# Patient Record
Sex: Female | Born: 1980
Health system: Southern US, Community
[De-identification: ages and names within clinical notes are randomized; demographics above are authoritative.]

## PROBLEM LIST (undated history)

## (undated) DIAGNOSIS — F419 Anxiety disorder, unspecified: Secondary | ICD-10-CM

## (undated) DIAGNOSIS — R87619 Unspecified abnormal cytological findings in specimens from cervix uteri: Secondary | ICD-10-CM

## (undated) HISTORY — DX: Unspecified abnormal cytological findings in specimens from cervix uteri: R87.619

## (undated) HISTORY — DX: Anxiety disorder, unspecified: F41.9

---

## 2007-05-03 ENCOUNTER — Emergency Department (HOSPITAL_COMMUNITY): Admission: EM | Admit: 2007-05-03 | Discharge: 2007-05-03 | Payer: Self-pay | Admitting: Emergency Medicine

## 2008-06-08 ENCOUNTER — Emergency Department (HOSPITAL_COMMUNITY): Admission: EM | Admit: 2008-06-08 | Discharge: 2008-06-08 | Payer: Self-pay | Admitting: Emergency Medicine

## 2008-08-20 ENCOUNTER — Emergency Department (HOSPITAL_COMMUNITY): Admission: EM | Admit: 2008-08-20 | Discharge: 2008-08-20 | Payer: Self-pay | Admitting: Emergency Medicine

## 2008-10-04 ENCOUNTER — Emergency Department (HOSPITAL_COMMUNITY): Admission: EM | Admit: 2008-10-04 | Discharge: 2008-10-04 | Payer: Self-pay | Admitting: Emergency Medicine

## 2008-12-09 ENCOUNTER — Emergency Department (HOSPITAL_COMMUNITY): Admission: EM | Admit: 2008-12-09 | Discharge: 2008-12-09 | Payer: Self-pay | Admitting: Emergency Medicine

## 2010-09-13 LAB — POCT RAPID STREP A (OFFICE): Streptococcus, Group A Screen (Direct): NEGATIVE

## 2010-09-15 LAB — POCT I-STAT, CHEM 8
Calcium, Ion: 1.19 mmol/L (ref 1.12–1.32)
Hemoglobin: 14.3 g/dL (ref 12.0–15.0)
Potassium: 4.1 mEq/L (ref 3.5–5.1)
Sodium: 142 mEq/L (ref 135–145)

## 2011-12-05 ENCOUNTER — Encounter (HOSPITAL_COMMUNITY): Payer: Self-pay | Admitting: *Deleted

## 2011-12-05 ENCOUNTER — Emergency Department (HOSPITAL_COMMUNITY): Payer: BC Managed Care – PPO

## 2011-12-05 ENCOUNTER — Emergency Department (HOSPITAL_COMMUNITY)
Admission: EM | Admit: 2011-12-05 | Discharge: 2011-12-06 | Disposition: A | Discharge status: Home / Self Care | Payer: BC Managed Care – PPO | Attending: Emergency Medicine | Admitting: Emergency Medicine

## 2011-12-05 DIAGNOSIS — R319 Hematuria, unspecified: Secondary | ICD-10-CM

## 2011-12-05 DIAGNOSIS — R102 Pelvic and perineal pain: Secondary | ICD-10-CM

## 2011-12-05 DIAGNOSIS — R1031 Right lower quadrant pain: Secondary | ICD-10-CM | POA: Insufficient documentation

## 2011-12-05 DIAGNOSIS — F17200 Nicotine dependence, unspecified, uncomplicated: Secondary | ICD-10-CM | POA: Insufficient documentation

## 2011-12-05 DIAGNOSIS — R1024 Suprapubic pain: Secondary | ICD-10-CM

## 2011-12-05 DIAGNOSIS — N760 Acute vaginitis: Secondary | ICD-10-CM

## 2011-12-05 LAB — URINALYSIS, ROUTINE W REFLEX MICROSCOPIC
Glucose, UA: NEGATIVE mg/dL
Nitrite: NEGATIVE
Specific Gravity, Urine: 1.002 — ABNORMAL LOW (ref 1.005–1.030)
pH: 7 (ref 5.0–8.0)

## 2011-12-05 LAB — CBC WITH DIFFERENTIAL/PLATELET
Basophils Absolute: 0 10*3/uL (ref 0.0–0.1)
Basophils Relative: 0 % (ref 0–1)
Eosinophils Absolute: 0.2 10*3/uL (ref 0.0–0.7)
Eosinophils Relative: 2 % (ref 0–5)
HCT: 40.9 % (ref 36.0–46.0)
Lymphocytes Relative: 23 % (ref 12–46)
Lymphs Abs: 3 10*3/uL (ref 0.7–4.0)
Monocytes Absolute: 0.7 10*3/uL (ref 0.1–1.0)
Monocytes Relative: 5 % (ref 3–12)
Neutro Abs: 9.1 10*3/uL — ABNORMAL HIGH (ref 1.7–7.7)
Platelets: 286 10*3/uL (ref 150–400)

## 2011-12-05 LAB — COMPREHENSIVE METABOLIC PANEL
ALT: 12 U/L (ref 0–35)
Alkaline Phosphatase: 82 U/L (ref 39–117)
Calcium: 9.7 mg/dL (ref 8.4–10.5)
GFR calc Af Amer: >90 mL/min (ref >90)
Glucose, Bld: 86 mg/dL (ref 70–99)
Sodium: 137 mEq/L (ref 135–145)
Total Bilirubin: 0.3 mg/dL (ref 0.3–1.2)

## 2011-12-05 LAB — URINE MICROSCOPIC-ADD ON

## 2011-12-05 LAB — PREGNANCY, URINE: Preg Test, Ur: NEGATIVE

## 2011-12-05 MED ORDER — OXYCODONE-ACETAMINOPHEN 5-325 MG PO TABS
1.0000 | ORAL_TABLET | Freq: Once | ORAL | Status: AC
  Administered 2011-12-05: 1 via ORAL
  Filled 2011-12-05: qty 1

## 2011-12-05 MED ORDER — PHENAZOPYRIDINE HCL 100 MG PO TABS
95.0000 mg | ORAL_TABLET | Freq: Once | ORAL | Status: AC
  Administered 2011-12-06: 100 mg via ORAL
  Filled 2011-12-05: qty 1

## 2011-12-05 NOTE — ED Notes (Signed)
She has had bloody urine with urinary frequency and some lower abd pain just started earlier today. lmp  3 weeks ago

## 2011-12-05 NOTE — ED Provider Notes (Signed)
History     CSN: [id number]  Arrival date & time 12/05/11  1919   First MD Initiated Contact with Patient 12/05/11 2115      Chief Complaint  Patient presents with  . Abdominal Pain    (Consider location/radiation/quality/duration/timing/severity/associated sxs/prior treatment) HPI Comments: Patient is a current everyday smoker who presents emergency department with chief complaint of hematuria.  Associated symptoms include abdominal pain, urinary frequency, dysuria.  Patient reports symptoms began acutely today around lunchtime while at work.  Pain is rated at a 8/10 and is described as a colicky crampy type feeling.  She denies any CVA tenderness, back pain, vaginal discharge, recent upper respiratory infection, family history of renal disease, flank pain, recent trauma, bleeding disorder, cyclic hematuria, recent travel, nausea, vomiting, change in bowel movements.  Last menstrual period was 3 weeks ago.  Patient is a 32 y.o. female presenting with abdominal pain. The history is provided by the patient.  Abdominal Pain The primary symptoms of the illness include abdominal pain.    History reviewed. No pertinent past medical history.  History reviewed. No pertinent past surgical history.  History reviewed. No pertinent family history.  History  Substance Use Topics  . Smoking status: Current Everyday Smoker  . Smokeless tobacco: Not on file  . Alcohol Use: Yes    OB History    Grav Para Term Preterm Abortions TAB SAB Ect Mult Living                  Review of Systems  Gastrointestinal: Positive for abdominal pain.  All other systems reviewed and are negative.    Allergies  Review of patient's allergies indicates no known allergies.  Home Medications  No current outpatient prescriptions on file.  BP 107/61  Pulse 66  Temp 98.2 F (36.8 C) (Oral)  Resp 18  SpO2 100%  LMP 11/14/2011  Physical Exam  Nursing note and vitals reviewed. Constitutional: She is  oriented to person, place, and time. She appears well-developed and well-nourished. No distress.  HENT:  Head: Normocephalic and atraumatic.  Mouth/Throat: Oropharynx is clear and moist. No oropharyngeal exudate.  Eyes: Conjunctivae and EOM are normal. Pupils are equal, round, and reactive to light. No scleral icterus.  Neck: Normal range of motion. Neck supple. No tracheal deviation present. No thyromegaly present.  Cardiovascular: Normal rate, regular rhythm, normal heart sounds and intact distal pulses.   Pulmonary/Chest: Effort normal and breath sounds normal. No stridor. No respiratory distress. She has no wheezes.  Abdominal: Soft. Normal appearance, normal aorta and bowel sounds are normal. There is tenderness in the suprapubic area.    Genitourinary:       Exam performed by Jaci Carrel,  exam chaperoned Date: 12/06/2011 Pelvic exam: normal external genitalia without evidence of trauma. VULVA: normal appearing vulva with no masses, tenderness or lesion. VAGINA: normal appearing vagina with normal color and discharge, no lesions. CERVIX: normal appearing cervix without lesions, cervical motion tenderness absent, cervical os closed with out purulent discharge; vaginal discharge - white, Wet prep and DNA probe for chlamydia and GC obtained.   ADNEXA: normal adnexa in size, nontender and no masses UTERUS: uterus is normal size, shape, consistency and nontender.    Musculoskeletal: Normal range of motion. She exhibits no edema and no tenderness.  Neurological: She is alert and oriented to person, place, and time. Coordination normal.  Skin: Skin is warm and dry. No rash noted. She is not diaphoretic. No erythema. No pallor.  Psychiatric: She has a  normal mood and affect. Her behavior is normal.    ED Course  Procedures (including critical care time)  Labs Reviewed  URINALYSIS, ROUTINE W REFLEX MICROSCOPIC - Abnormal; Notable for the following:    Color, Urine RED (*)  BIOCHEMICALS  MAY BE AFFECTED BY COLOR   APPearance HAZY (*)     Specific Gravity, Urine 1.002 (*)     Hgb urine dipstick LARGE (*)     Protein, ur 30 (*)     Leukocytes, UA LARGE (*)     All other components within normal limits  CBC WITH DIFFERENTIAL - Abnormal; Notable for the following:    WBC 13.0 (*)     Neutro Abs 9.1 (*)     All other components within normal limits  URINE MICROSCOPIC-ADD ON - Abnormal; Notable for the following:    Squamous Epithelial / LPF FEW (*)     All other components within normal limits  PREGNANCY, URINE  COMPREHENSIVE METABOLIC PANEL  URINE CULTURE  WET PREP, GENITAL  GC/CHLAMYDIA PROBE AMP, GENITAL   US Renal  12/05/2011  *RADIOLOGY REPORT*  Clinical Data: Hematuria, right lower quadrant and suprapubic pain.  RENAL/URINARY TRACT ULTRASOUND COMPLETE  Comparison:  None  Findings:  Right Kidney:  10.0 cm. Normal size and echotexture.  No focal abnormality.  No hydronephrosis.  Left Kidney:  10.0 cm. Normal size and echotexture.  No focal abnormality.  No hydronephrosis.  Bladder:  Normal appearance.  Bilateral ureteral jets noted.  IMPRESSION: Normal study.  Original Report Authenticated By: Cyndie Chime, M.D.     No diagnosis found.    MDM  Gross Hematuria, Suprapubic pain, BV  Urine culture pending.  No evidence of hydronephrosis on ultrasound.  Pelvic exam with no adnexal or cervical motion tenderness.  Pt has been advised to followup with urology in regards to her hematuria.  Patient will be given pain medications and instructions to take Pyridium on discharge to help with urinary symptoms.  Patient verbalizes understanding and is stable in no acute distress prior to discharge. Pt treated for [facility] w clinda 300mg  BID x 7 days.  Patient appears reliable source for followup.        Jaci Carrel, [place] 12/06/11 [phone]

## 2011-12-05 NOTE — ED Notes (Addendum)
Pt. Reports having blood in her urine and clots at approx 2000. "When I went to the bathroom my urine was right red, I thought I had started my period, but I haven't. I was not having any issues earlier today. It came on all of a sudden."  Burning and sharp pain with urination x 1 day.  Flank pain x 1 day. Abdominal pain in lower quadrants bilaterally. Denies N/V/D. No bowel movement since urinary symptoms occurred. Not aware of blood in stool. A.O. X 4.

## 2011-12-06 LAB — GC/CHLAMYDIA PROBE AMP, GENITAL: Chlamydia, DNA Probe: NEGATIVE

## 2011-12-06 LAB — WET PREP, GENITAL: Trich, Wet Prep: NONE SEEN

## 2011-12-06 MED ORDER — CLINDAMYCIN HCL 150 MG PO CAPS: 300.0000 mg | ORAL_CAPSULE | Freq: Two times a day (BID) | ORAL | Status: AC

## 2011-12-06 MED ORDER — OXYCODONE-ACETAMINOPHEN 5-325 MG PO TABS: 2.0000 | ORAL_TABLET | ORAL | Status: AC | PRN

## 2011-12-06 MED ORDER — CLINDAMYCIN HCL 300 MG PO CAPS
300.0000 mg | ORAL_CAPSULE | Freq: Once | ORAL | Status: AC
  Administered 2011-12-06: 300 mg via ORAL
  Filled 2011-12-06: qty 1

## 2011-12-06 MED ORDER — PHENAZOPYRIDINE HCL 95 MG PO TABS: 95.0000 mg | ORAL_TABLET | Freq: Three times a day (TID) | ORAL | Status: AC | PRN

## 2011-12-06 NOTE — ED Provider Notes (Signed)
Medical screening examination/treatment/procedure(s) were performed by non-physician practitioner and as supervising physician I was immediately available for consultation/collaboration.  Constantine Ruddick R. Herminio Kniskern, MD 12/06/11 2354 

## 2011-12-07 LAB — URINE CULTURE: Colony Count: >=100000

## 2011-12-08 NOTE — ED Notes (Signed)
+   Urine Chart sent to EDP office for review. 

## 2011-12-12 NOTE — ED Notes (Signed)
Pt called for results, not feeling any better.  Pt informed of (+) URNC and need for additional tx.  Rx called to [facility] [phone].

## 2013-05-29 IMAGING — US US RENAL
1 series · 14 of 25 positions shown · non-contrast
Comparison: None

CLINICAL DATA: Hematuria, right lower quadrant and suprapubic pain.

RENAL/URINARY TRACT ULTRASOUND COMPLETE

[Series 1: us renal · 0.20mm/px · 14 of 26 slices shown]
[im 1/26]
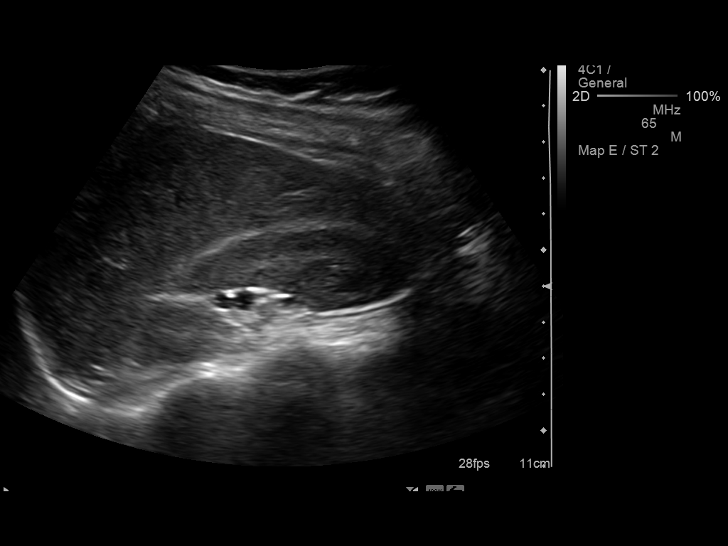
[im 3/26]
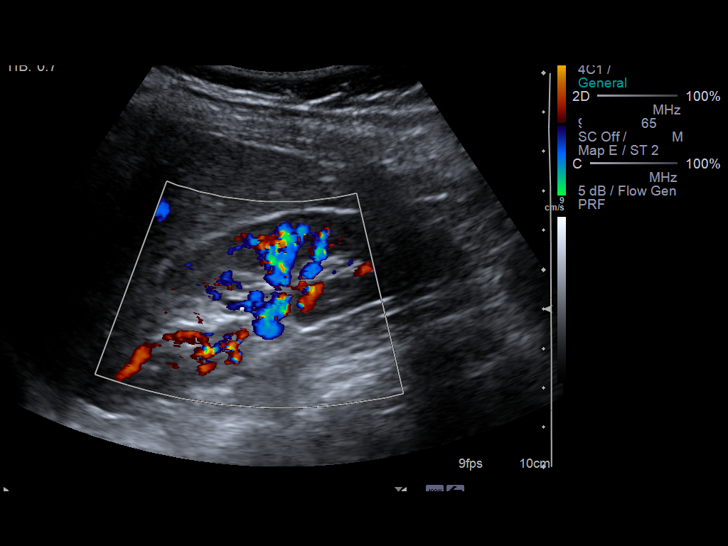
[im 5/26]
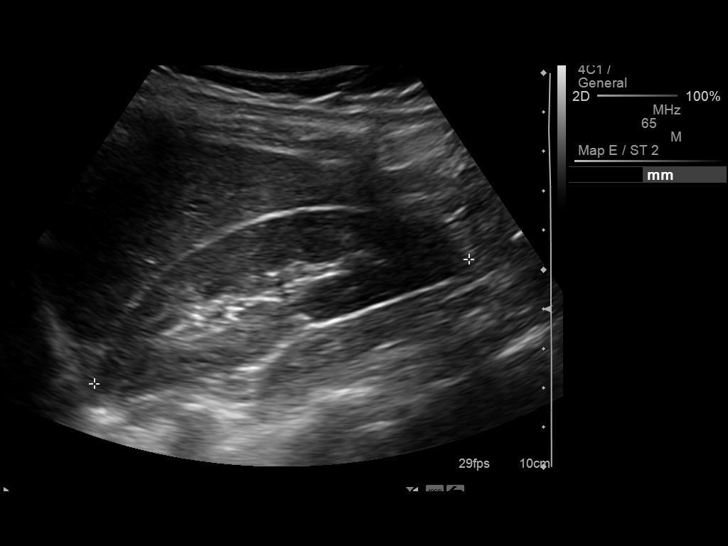
[im 7/26]
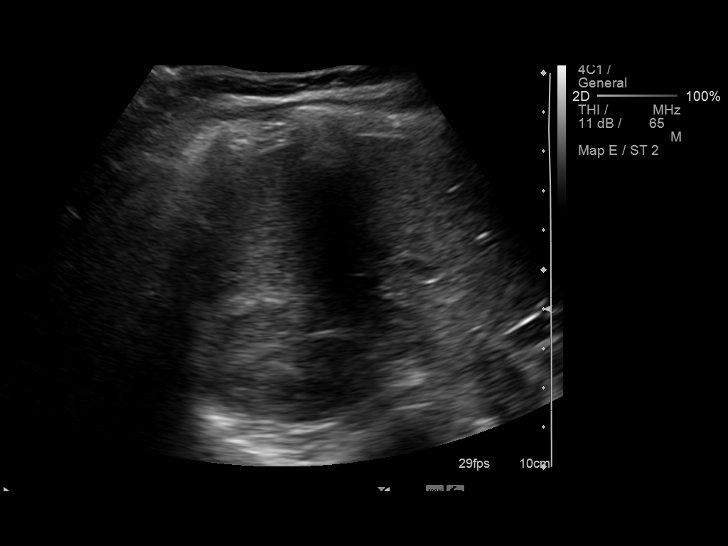
[im 9/26]
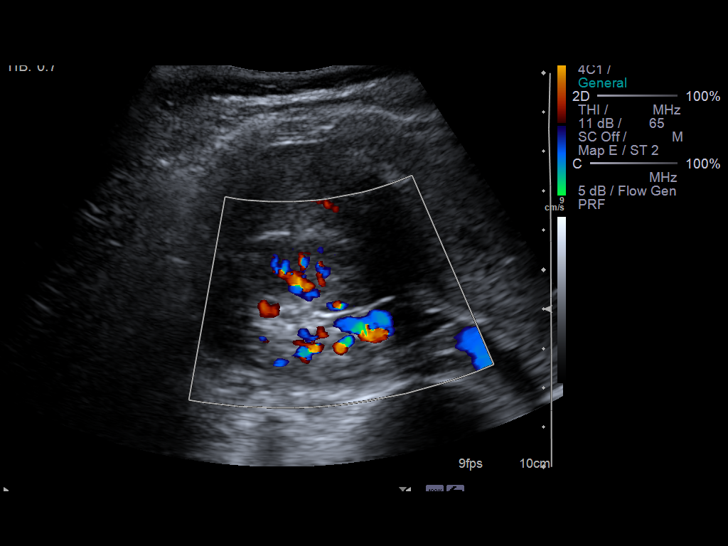
[im 10/26]
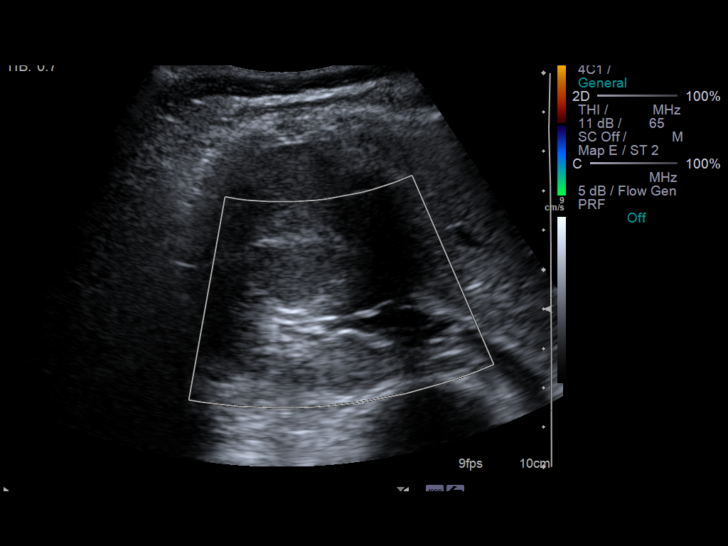
[im 12/26]
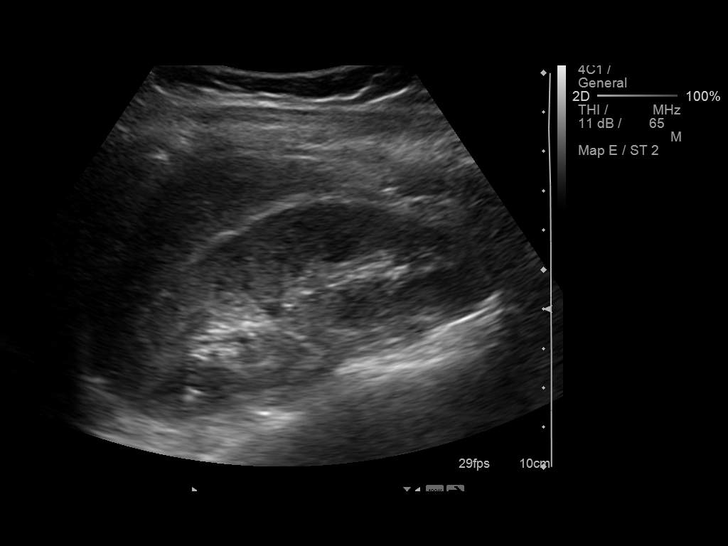
[im 14/26]
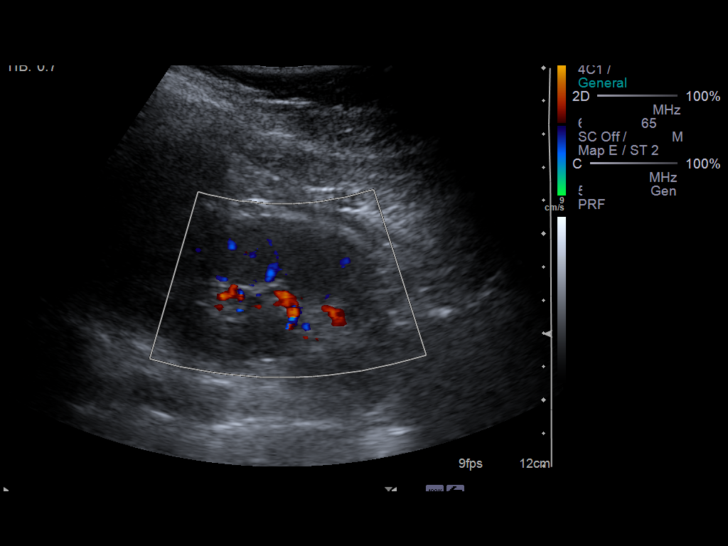
[im 16/26]
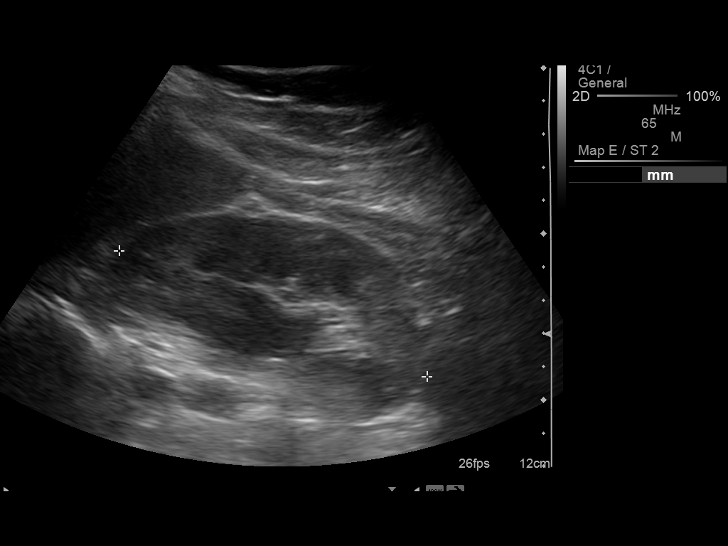
[im 17/26]
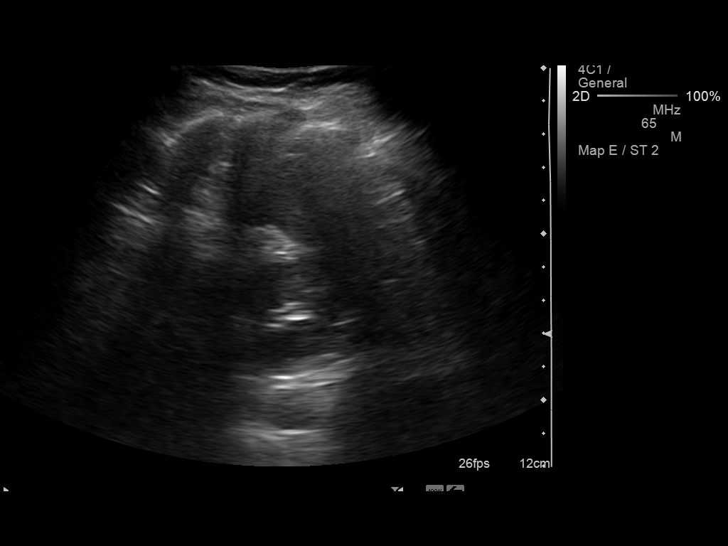
[im 19/26]
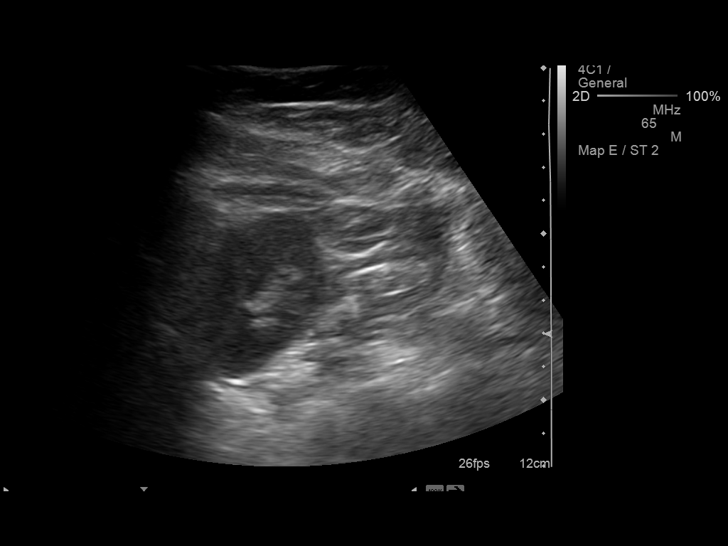
[im 21/26]
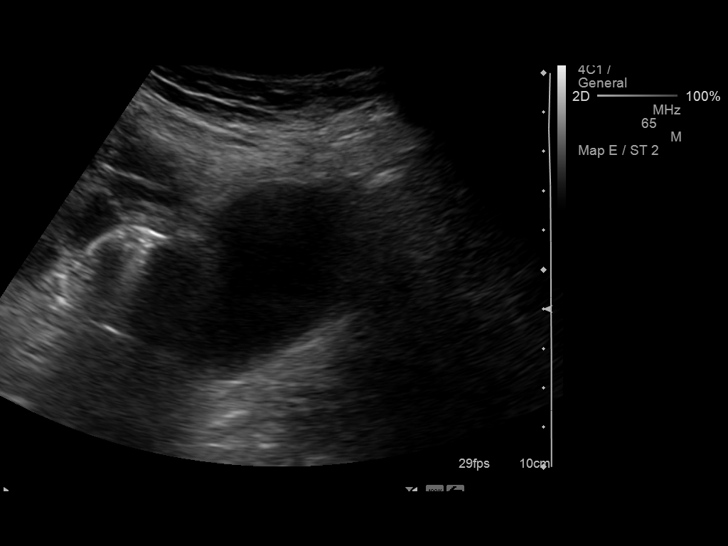
[im 23/26]
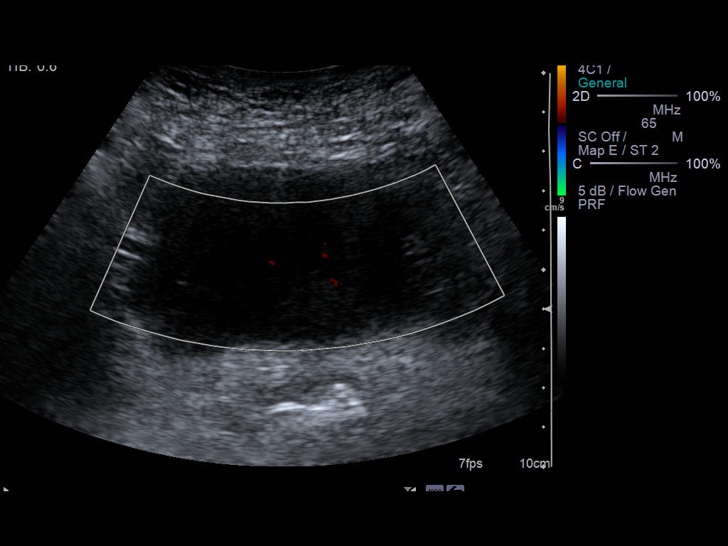
[im 26/26]
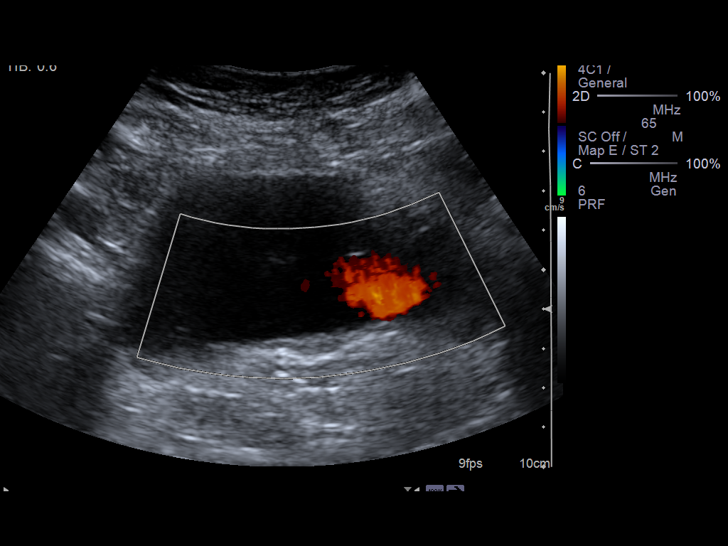

[14 of 25 positions shown; findings below may reference images not displayed]

FINDINGS: Right Kidney:  10.0 cm. Normal size and echotexture.  No focal
abnormality.  No hydronephrosis.

Left Kidney:  10.0 cm. Normal size and echotexture.  No focal
abnormality.  No hydronephrosis.

Bladder:  Normal appearance.  Bilateral ureteral jets noted.
IMPRESSION: Normal study.

## 2014-05-04 ENCOUNTER — Ambulatory Visit (INDEPENDENT_AMBULATORY_CARE_PROVIDER_SITE_OTHER): Payer: BC Managed Care – PPO | Admitting: Family Medicine

## 2014-05-04 ENCOUNTER — Encounter: Payer: Self-pay | Admitting: Family Medicine

## 2014-05-04 VITALS — BP 106/80 | HR 77 | Temp 98.8°F | Resp 16 | Ht 62.0 in | Wt 127.2 lb

## 2014-05-04 DIAGNOSIS — D649 Anemia, unspecified: Secondary | ICD-10-CM

## 2014-05-04 DIAGNOSIS — R42 Dizziness and giddiness: Secondary | ICD-10-CM

## 2014-05-04 LAB — CBC
HCT: 34.8 % — ABNORMAL LOW (ref 36.0–46.0)
Hemoglobin: 11.8 g/dL — ABNORMAL LOW (ref 12.0–15.0)
MCH: 29.1 pg (ref 26.0–34.0)
MCHC: 33.9 g/dL (ref 30.0–36.0)
MCV: 85.9 fL (ref 78.0–100.0)
MPV: 9.6 fL (ref 9.4–12.4)
Platelets: 332 10*3/uL (ref 150–400)
RBC: 4.05 MIL/uL (ref 3.87–5.11)
RDW: 13.6 % (ref 11.5–15.5)
WBC: 6.3 10*3/uL (ref 4.0–10.5)

## 2014-05-04 NOTE — Progress Notes (Signed)
   Subjective:    Patient ID: Lori LundElizabeth Radilla, female    DOB: 09-Aug-1980, 33 y.o.   MRN: [id number]  HPI Patient presents today to establish care with a PCP and have DMV paperwork completed.   In June, the patient drove to work and had an episode of lightheadedness. She was in the parking lot at work and EMS was called. She refused transport to the ED because she did not feel that the episode warranted a trip to the ED. The episode was transient, lasting a few minutes. She felt like she had difficulty focusing and felt "fuzzy". She felt cold and clammy and her palms were sweaty. She has not had further episodes. She thinks it may have been a panic attack as she was going through a difficult time with her boyfriend and it was the same as episodes she had as a teenager. She [facility] . She has gone to several urgent cares to get the paper work completed, but has been unable to find someone to complete them.   Episode- Felt like it was difficult to focus. Lasted a couple of minutes. Felt fuzzy, cold and clammy and palms were sweaty. Fingers felt tingly. No illicit drugs.   Works for a [employer]ing [employer]. Some stress with job. Has 32 yo and 586 yo children. Is in a stable relationship.   Last pap 2 years ago. Had abnormal pap in her 1420s, most recent pap normal.   No past medical history on file. No past surgical history on file. No family history on file. History  Substance Use Topics  . Smoking status: Current Every Day Smoker  . Smokeless tobacco: Not on file  . Alcohol Use: Yes    Review of Systems No chest pain, no SOB, no palpitations, occasional headaches. No fever/chills. No falls.     Objective:   Physical Exam  Constitutional: She is oriented to person, place, and time. She appears well-developed and well-nourished.  HENT:  Head: Normocephalic and atraumatic.  Right Ear: Tympanic membrane, external ear and ear canal normal.    Left Ear: Tympanic membrane, external ear and ear canal normal.  Nose: Nose normal.  Mouth/Throat: Oropharynx is clear and moist.  Eyes: Conjunctivae and EOM are normal. Pupils are equal, round, and reactive to light. Right eye exhibits no discharge. Left eye exhibits no discharge. No scleral icterus.  Neck: Normal range of motion. Neck supple. No thyromegaly present.  Cardiovascular: Normal rate, regular rhythm, normal heart sounds and intact distal pulses.   Pulmonary/Chest: Effort normal and breath sounds normal.  Musculoskeletal: Normal range of motion. She exhibits no edema.  Lymphadenopathy:    She has no cervical adenopathy.  Neurological: She is alert and oriented to person, place, and time.  Skin: Skin is warm and dry.  Psychiatric: She has a normal mood and affect. Her behavior is normal. Judgment and thought content normal.  Vitals reviewed.     Assessment & Plan:  Discussed with Dr. Katrinka BlazingSmith  1. Light headedness - EKG 12-Lead - CBC - Comprehensive metabolic panel - TSH - I will keep paperwork in my drawer until labs return and will call her once completed.   Emi Belfasteborah B. Allis Quirarte, FNP-BC  [facility], [facility]  05/04/2014 4:56 PM

## 2014-05-05 LAB — COMPREHENSIVE METABOLIC PANEL
ALBUMIN: 4.1 g/dL (ref 3.5–5.2)
ALK PHOS: 61 U/L (ref 39–117)
ALT: 14 U/L (ref 0–35)
AST: 16 U/L (ref 0–37)
BILIRUBIN TOTAL: 0.2 mg/dL (ref 0.2–1.2)
BUN: 8 mg/dL (ref 6–23)
CALCIUM: 8.9 mg/dL (ref 8.4–10.5)
CHLORIDE: 107 meq/L (ref 96–112)
CO2: 24 mEq/L (ref 19–32)
CREATININE: 0.51 mg/dL (ref 0.50–1.10)
GLUCOSE: 92 mg/dL (ref 70–99)
Potassium: 4 mEq/L (ref 3.5–5.3)
Sodium: 140 mEq/L (ref 135–145)
TOTAL PROTEIN: 6.5 g/dL (ref 6.0–8.3)

## 2014-05-05 LAB — TSH: TSH: 2.001 u[IU]/mL (ref 0.350–4.500)

## 2014-12-12 ENCOUNTER — Emergency Department (HOSPITAL_COMMUNITY)
Admission: EM | Admit: 2014-12-12 | Discharge: 2014-12-12 | Disposition: A | Discharge status: Home / Self Care | Payer: BLUE CROSS/BLUE SHIELD | Source: Home / Self Care

## 2014-12-12 ENCOUNTER — Encounter (HOSPITAL_COMMUNITY): Payer: Self-pay | Admitting: Emergency Medicine

## 2014-12-12 DIAGNOSIS — N390 Urinary tract infection, site not specified: Secondary | ICD-10-CM

## 2014-12-12 LAB — POCT URINALYSIS DIP (DEVICE)
Glucose, UA: 500 mg/dL — AB
Ketones, ur: 15 mg/dL — AB
Nitrite: POSITIVE — AB
Protein, ur: >=300 mg/dL — AB
Specific Gravity, Urine: 1.01 (ref 1.005–1.030)
Urobilinogen, UA: >=8 mg/dL (ref 0.0–1.0)
pH: 6 (ref 5.0–8.0)

## 2014-12-12 LAB — POCT PREGNANCY, URINE: Preg Test, Ur: NEGATIVE

## 2014-12-12 MED ORDER — CIPROFLOXACIN HCL 500 MG PO TABS: 500.0000 mg | ORAL_TABLET | Freq: Two times a day (BID) | ORAL | Status: DC

## 2014-12-12 NOTE — ED Notes (Signed)
C/o uti

## 2014-12-12 NOTE — ED Provider Notes (Signed)
CSN: [id number]     Arrival date & time 12/12/14  1626 History   First MD Initiated Contact with Patient 12/12/14 1731     Chief Complaint  Patient presents with  . Urinary Tract Infection   (Consider location/radiation/quality/duration/timing/severity/associated sxs/prior Treatment) Patient is a 34 y.o. female presenting with urinary tract infection. The history is provided by the patient.  Urinary Tract Infection Pain quality:  Burning Pain severity:  Moderate Onset quality:  Gradual Duration:  4 days Timing:  Constant Progression:  Worsening Chronicity:  New Recent urinary tract infections: no   Relieved by:  Cranberry juice and phenazopyridine Worsened by:  Nothing tried Ineffective treatments:  Cranberry juice and phenazopyridine Urinary symptoms: frequent urination and hematuria   Risk factors: sexually active     History reviewed. No pertinent past medical history. History reviewed. No pertinent past surgical history. History reviewed. No pertinent family history. History  Substance Use Topics  . Smoking status: Current Every Day Smoker  . Smokeless tobacco: Not on file  . Alcohol Use: Yes   OB History    No data available     Review of Systems  Allergies  Review of patient's allergies indicates no known allergies.  Home Medications   Prior to Admission medications   Medication Sig Start Date End Date Taking? Authorizing Provider  ciprofloxacin (CIPRO) 500 MG tablet Take 1 tablet (500 mg total) by mouth 2 (two) times daily. 12/12/14   Elvina SidleKurt Artesia Berkey, MD  Multiple Vitamins-Minerals (MULTI-VITAMIN GUMMIES) CHEW Chew by mouth 4 (four) times a week.    Historical Provider, MD   BP 102/76 mmHg  Pulse 66  Temp(Src) 97.6 F (36.4 C) (Oral)  Resp 12  SpO2 98%  LMP 11/28/2014 Physical Exam  Constitutional: She appears well-developed and well-nourished.  Abdominal: Soft. Bowel sounds are normal. She exhibits no distension and no mass. There is no tenderness.  There is no rebound and no guarding.  Nursing note and vitals reviewed.   ED Course  Procedures (including critical care time) Labs Review Labs Reviewed  POCT URINALYSIS DIP (DEVICE) - Abnormal; Notable for the following:    Glucose, UA 500 (*)    Bilirubin Urine MODERATE (*)    Ketones, ur 15 (*)    Hgb urine dipstick MODERATE (*)    Protein, ur >=300 (*)    Nitrite POSITIVE (*)    Leukocytes, UA LARGE (*)    All other components within normal limits  POCT PREGNANCY, URINE    Imaging Review No results found.   MDM   1. UTI (lower urinary tract infection)    Elvina SidleKurt Tremond Shimabukuro, MD    Elvina SidleKurt Brynli Ollis, MD 12/12/14 [phone]

## 2014-12-12 NOTE — Discharge Instructions (Signed)

## 2014-12-14 LAB — URINE CULTURE: Culture: >=100000

## 2018-11-29 ENCOUNTER — Ambulatory Visit: Payer: BLUE CROSS/BLUE SHIELD | Admitting: Family Medicine

## 2018-11-29 ENCOUNTER — Other Ambulatory Visit: Payer: Self-pay

## 2018-11-29 ENCOUNTER — Encounter: Payer: Self-pay | Admitting: Family Medicine

## 2018-11-29 ENCOUNTER — Ambulatory Visit (INDEPENDENT_AMBULATORY_CARE_PROVIDER_SITE_OTHER): Payer: BC Managed Care – PPO | Admitting: Family Medicine

## 2018-11-29 ENCOUNTER — Ambulatory Visit: Payer: Self-pay | Admitting: Registered Nurse

## 2018-11-29 VITALS — BP 101/68 | HR 74 | Temp 98.4°F | Resp 16 | Ht 66.0 in | Wt 151.0 lb

## 2018-11-29 DIAGNOSIS — Z0001 Encounter for general adult medical examination with abnormal findings: Secondary | ICD-10-CM

## 2018-11-29 DIAGNOSIS — Z23 Encounter for immunization: Secondary | ICD-10-CM

## 2018-11-29 DIAGNOSIS — Z130 Encounter for screening for diseases of the blood and blood-forming organs and certain disorders involving the immune mechanism: Secondary | ICD-10-CM | POA: Diagnosis not present

## 2018-11-29 DIAGNOSIS — Z13228 Encounter for screening for other metabolic disorders: Secondary | ICD-10-CM

## 2018-11-29 DIAGNOSIS — Z1329 Encounter for screening for other suspected endocrine disorder: Secondary | ICD-10-CM | POA: Diagnosis not present

## 2018-11-29 DIAGNOSIS — F411 Generalized anxiety disorder: Secondary | ICD-10-CM

## 2018-11-29 DIAGNOSIS — Z113 Encounter for screening for infections with a predominantly sexual mode of transmission: Secondary | ICD-10-CM | POA: Diagnosis not present

## 2018-11-29 DIAGNOSIS — Z0000 Encounter for general adult medical examination without abnormal findings: Secondary | ICD-10-CM

## 2018-11-29 DIAGNOSIS — Z13220 Encounter for screening for lipoid disorders: Secondary | ICD-10-CM

## 2018-11-29 LAB — POCT URINALYSIS DIP (MANUAL ENTRY)
Bilirubin, UA: NEGATIVE
Blood, UA: NEGATIVE
Glucose, UA: NEGATIVE mg/dL
Leukocytes, UA: NEGATIVE
Nitrite, UA: NEGATIVE
Protein Ur, POC: NEGATIVE mg/dL
Spec Grav, UA: >=1.03 — AB (ref 1.010–1.025)
Urobilinogen, UA: 0.2 E.U./dL
pH, UA: 5 (ref 5.0–8.0)

## 2018-11-29 NOTE — Progress Notes (Signed)
6/26/20202:03 PM  Lori LundElizabeth Thompson 1980-09-20, 38 y.o., female [id number]  Chief Complaint  Patient presents with  . Annual Exam    no pap    HPI:   Patient is a 38 y.o. female who presents today for CPE  New patient G&Ps: 2002 Pap: dec 2017, neg pap and HPV, remote h/o abnormal pap s/p LEEP, normal paps since STD: testing today BC : BTL Menses: monthly, regular, LMP 11/21/2018 Mammogram: never had FHX breast/ovarian cancer: Maternal aunt ovarian cancer, postmenopausal FHx colon cancer: denies Exercise/diet: 3 miles treadmill daily, average, tries to eat healthy Former smoker: quit feb 2017 Etoh: declines  ilicit drugs: declines Sees dentist twice a year Sees eye doctor yearly  Reports anxiety has been increasing of recent Has started to exercise for past 3 weeks, helping Never done counseling nor medications - prefers to manage with LFM   There is no immunization history on file for this patient.  Depression screen [facility] 2/9 11/29/2018 05/04/2014  Decreased Interest 0 0  Down, Depressed, Hopeless 0 0  PHQ - 2 Score 0 0    Fall Risk  11/29/2018 05/04/2014  Falls in the past year? 0 No  Number falls in past yr: 1 -  Injury with Fall? 0 -     No Known Allergies  Prior to Admission medications   Not on File    Past Medical History:  Diagnosis Date  . Abnormal Pap smear of cervix   . Anxiety     Past Surgical History:  Procedure Laterality Date  . CESAREAN SECTION      Social History   Tobacco Use  . Smoking status: Former Smoker    Quit date: 07/2015    Years since quitting: 3.4  . Smokeless tobacco: Never Used  Substance Use Topics  . Alcohol use: Not Currently    Family History  Problem Relation Age of Onset  . Hyperlipidemia Mother   . Stroke Maternal Grandfather   . Heart disease Paternal Grandmother     Review of Systems  Constitutional: Negative for chills and fever.  Respiratory: Negative for cough and shortness of breath.    Cardiovascular: Negative for chest pain, palpitations and leg swelling.  Gastrointestinal: Negative for abdominal pain, nausea and vomiting.  All other systems reviewed and are negative.    OBJECTIVE:  Today's Vitals   11/29/18 1351  BP: 101/68  Pulse: 74  Resp: 16  Temp: 98.4 F (36.9 C)  TempSrc: Oral  SpO2: 96%  Weight: 151 lb (68.5 kg)  Height: 5\' 6"  (1.676 m)   Body mass index is 24.37 kg/m.  Hearing Screening   125Hz  250Hz  500Hz  1000Hz  2000Hz  3000Hz  4000Hz  6000Hz  8000Hz   Right ear:           Left ear:             Visual Acuity Screening   Right eye Left eye Both eyes  Without correction:     With correction: 20/13 20/13 20/13   uses contact lenses  Physical Exam Vitals signs and nursing note reviewed. Exam conducted with a chaperone present.  Constitutional:      Appearance: She is well-developed.  HENT:     Head: Normocephalic and atraumatic.     Right Ear: Hearing, tympanic membrane, ear canal and external ear normal.     Left Ear: Hearing, tympanic membrane, ear canal and external ear normal.     Mouth/Throat:     Mouth: Mucous membranes are moist.     Pharynx: No  oropharyngeal exudate or posterior oropharyngeal erythema.  Eyes:     Extraocular Movements: Extraocular movements intact.     Conjunctiva/sclera: Conjunctivae normal.     Pupils: Pupils are equal, round, and reactive to light.  Neck:     Musculoskeletal: Neck supple.     Thyroid: No thyromegaly.  Cardiovascular:     Rate and Rhythm: Normal rate and regular rhythm.     Heart sounds: Normal heart sounds. No murmur. No friction rub. No gallop.   Pulmonary:     Effort: Pulmonary effort is normal.     Breath sounds: Normal breath sounds. No wheezing, rhonchi or rales.  Chest:     Breasts:        Right: No mass, nipple discharge or skin change.        Left: No mass, nipple discharge or skin change.  Abdominal:     General: Bowel sounds are normal. There is no distension.     Palpations:  Abdomen is soft. There is no hepatomegaly, splenomegaly or mass.     Tenderness: There is no abdominal tenderness.  Musculoskeletal: Normal range of motion.     Right lower leg: No edema.     Left lower leg: No edema.  Lymphadenopathy:     Cervical: No cervical adenopathy.     Upper Body:     Right upper body: No supraclavicular, axillary or pectoral adenopathy.     Left upper body: No supraclavicular, axillary or pectoral adenopathy.  Skin:    General: Skin is warm and dry.  Neurological:     Mental Status: She is alert and oriented to person, place, and time.     Cranial Nerves: No cranial nerve deficit.     Gait: Gait normal.     Deep Tendon Reflexes: Reflexes are normal and symmetric.  Psychiatric:        Mood and Affect: Mood normal.        Behavior: Behavior normal.     Results for orders placed or performed in visit on 11/29/18 (from the past 24 hour(s))  POCT urinalysis dipstick     Status: Abnormal   Collection Time: 11/29/18  2:32 PM  Result Value Ref Range   Color, UA yellow yellow   Clarity, UA clear clear   Glucose, UA negative negative mg/dL   Bilirubin, UA negative negative   Ketones, POC UA small (15) (A) negative mg/dL   Spec Grav, UA >=5.284>=1.030 (A) 1.010 - 1.025   Blood, UA negative negative   pH, UA 5.0 5.0 - 8.0   Protein Ur, POC negative negative mg/dL   Urobilinogen, UA 0.2 0.2 or 1.0 E.U./dL   Nitrite, UA Negative Negative   Leukocytes, UA Negative Negative    ASSESSMENT and PLAN  1. Annual physical exam Routine HCM labs ordered. HCM reviewed/discussed. Anticipatory guidance regarding healthy weight, lifestyle and choices given.   2. Screen for STD (sexually transmitted disease) - HIV Antibody (routine testing w rflx) - GC/Chlamydia probe amp ([place])not at [facility]  3. Screening for thyroid disorder - TSH  4. Screening for deficiency anemia - CBC with Differential/Platelet  5. Screening for endocrine, metabolic and immunity disorder -  POCT urinalysis dipstick - Comprehensive metabolic panel  6. Screening for lipid disorders - Lipid panel  7. Generalized anxiety disorder Discussed treatment options, advised learning about mindful techniques. Call if she wants a referral for counseling, appt if wants to discuss medications  Other orders - Tdap vaccine greater than or equal to 7yo IM  Return in about 1 year (around 11/29/2019).   Rutherford Guys, MD [facility] at [place], [place] [address] Ph.  [phone] Fax [phone]

## 2018-11-29 NOTE — Patient Instructions (Addendum)
If you have lab work done today you will be contacted with your lab  Living With Anxiety  After being diagnosed with an anxiety disorder, you may be relieved to know why you have felt or behaved a certain way. It is natural to also feel overwhelmed about the treatment ahead and what it will mean for your life. With care and support, you can manage this condition and recover from it. How to cope with anxiety Dealing with stress Stress is your body's reaction to life changes and events, both good and bad. Stress can last just a few hours or it can be ongoing. Stress can play a major role in anxiety, so it is important to learn both how to cope with stress and how to think about it differently. Talk with your health care provider or a counselor to learn more about stress reduction. He or she may suggest some stress reduction techniques, such as:  Music therapy. This can include creating or listening to music that you enjoy and that inspires you.  Mindfulness-based meditation. This involves being aware of your normal breaths, rather than trying to control your breathing. It can be done while sitting or walking.  Centering prayer. This is a kind of meditation that involves focusing on a word, phrase, or sacred image that is meaningful to you and that brings you peace.  Deep breathing. To do this, expand your stomach and inhale slowly through your nose. Hold your breath for 3-5 seconds. Then exhale slowly, allowing your stomach muscles to relax.  Self-talk. This is a skill where you identify thought patterns that lead to anxiety reactions and correct those thoughts.  Muscle relaxation. This involves tensing muscles then relaxing them. Choose a stress reduction technique that fits your lifestyle and personality. Stress reduction techniques take time and practice. Set aside 5-15 minutes a day to do them. Therapists can offer training in these techniques. The training may be covered by some  insurance plans. Other things you can do to manage stress include:  Keeping a stress diary. This can help you learn what triggers your stress and ways to control your response.  Thinking about how you respond to certain situations. You may not be able to control everything, but you can control your reaction.  Making time for activities that help you relax, and not feeling guilty about spending your time in this way. Therapy combined with coping and stress-reduction skills provides the best chance for successful treatment. Medicines Medicines can help ease symptoms. Medicines for anxiety include:  Anti-anxiety drugs.  Antidepressants.  Beta-blockers. Medicines may be used as the main treatment for anxiety disorder, along with therapy, or if other treatments are not working. Medicines should be prescribed by a health care provider. Relationships Relationships can play a big part in helping you recover. Try to spend more time connecting with trusted friends and family members. Consider going to couples counseling, taking family education classes, or going to family therapy. Therapy can help you and others better understand the condition. How to recognize changes in your condition Everyone has a different response to treatment for anxiety. Recovery from anxiety happens when symptoms decrease and stop interfering with your daily activities at home or work. This may mean that you will start to:  Have better concentration and focus.  Sleep better.  Be less irritable.  Have more energy.  Have improved memory. It is important to recognize when your condition is getting worse. Contact your health care provider if  your symptoms interfere with home or work and you do not feel like your condition is improving. Where to find help and support: You can get help and support from these sources:  Self-help groups.  Online and [employer].  A trusted spiritual leader.  Couples  counseling.  Family education classes.  Family therapy. Follow these instructions at home:  Eat a healthy diet that includes plenty of vegetables, fruits, whole grains, low-fat dairy products, and lean protein. Do not eat a lot of foods that are high in solid fats, added sugars, or salt.  Exercise. Most adults should do the following: ? Exercise for at least 150 minutes each week. The exercise should increase your heart rate and make you sweat (moderate-intensity exercise). ? Strengthening exercises at least twice a week.  Cut down on caffeine, tobacco, alcohol, and other potentially harmful substances.  Get the right amount and quality of sleep. Most adults need 7-9 hours of sleep each night.  Make choices that simplify your life.  Take over-the-counter and prescription medicines only as told by your health care provider.  Avoid caffeine, alcohol, and certain over-the-counter cold medicines. These may make you feel worse. Ask your pharmacist which medicines to avoid.  Keep all follow-up visits as told by your health care provider. This is important. Questions to ask your health care provider  Would I benefit from therapy?  How often should I follow up with a health care provider?  How long do I need to take medicine?  Are there any long-term side effects of my medicine?  Are there any alternatives to taking medicine? Contact a health care provider if:  You have a hard time staying focused or finishing daily tasks.  You spend many hours a day feeling worried about everyday life.  You become exhausted by worry.  You start to have headaches, feel tense, or have nausea.  You urinate more than normal.  You have diarrhea. Get help right away if:  You have a racing heart and shortness of breath.  You have thoughts of hurting yourself or others. If you ever feel like you may hurt yourself or others, or have thoughts about taking your own life, get help right away. You  can go to your nearest emergency department or call:  Your local emergency services (911 in the [place]  A suicide crisis helpline, such as the [place] at 631-666-8647. This is open 24-hours a day. Summary  Taking steps to deal with stress can help calm you.  Medicines cannot cure anxiety disorders, but they can help ease symptoms.  Family, friends, and partners can play a big part in helping you recover from an anxiety disorder. This information is not intended to replace advice given to you by your health care provider. Make sure you discuss any questions you have with your health care provider. Document Released: 05/16/2016 Document Revised: 05/16/2016 Document Reviewed: 05/16/2016 Elsevier Interactive Patient Education  [employer]. results within the next 2 weeks.  If you have not heard from [place] then please contact us. The fastest way to get your results is to register for My Chart.   IF you received an x-ray today, you will receive an invoice from [facility]. Please contact [facility] at [phone] with questions or concerns regarding your invoice.   IF you received labwork today, you will receive an invoice from [facility]. Please contact [facility] at [phone] with questions or concerns regarding your invoice.   Our billing staff will not  be able to assist you with questions regarding bills from these companies.  You will be contacted with the lab results as soon as they are available. The fastest way to get your results is to activate your My Chart account. Instructions are located on the last page of this paperwork. If you have not heard from [place] regarding the results in 2 weeks, please contact this office.       Preventive Care 49-25 Years Old, Female Preventive care refers to visits with your health care provider and lifestyle choices that can promote health and wellness. This includes:  A yearly physical exam. This  may also be called an annual well check.  Regular dental visits and eye exams.  Immunizations.  Screening for certain conditions.  Healthy lifestyle choices, such as eating a healthy diet, getting regular exercise, not using drugs or products that contain nicotine and tobacco, and limiting alcohol use. What can I expect for my preventive care visit? Physical exam Your health care provider will check your:  Height and weight. This may be used to calculate body mass index (BMI), which tells if you are at a healthy weight.  Heart rate and blood pressure.  Skin for abnormal spots. Counseling Your health care provider may ask you questions about your:  Alcohol, tobacco, and drug use.  Emotional well-being.  Home and relationship well-being.  Sexual activity.  Eating habits.  Work and work Statistician.  Method of birth control.  Menstrual cycle.  Pregnancy history. What immunizations do I need?  Influenza (flu) vaccine  This is recommended every year. Tetanus, diphtheria, and pertussis (Tdap) vaccine  You may need a Td booster every 10 years. Varicella (chickenpox) vaccine  You may need this if you have not been vaccinated. Human papillomavirus (HPV) vaccine  If recommended by your health care provider, you may need three doses over 6 months. Measles, mumps, and rubella (MMR) vaccine  You may need at least one dose of MMR. You may also need a second dose. Meningococcal conjugate (MenACWY) vaccine  One dose is recommended if you are age 39-21 years and a first-year college student living in a residence hall, or if you have one of several medical conditions. You may also need additional booster doses. Pneumococcal conjugate (PCV13) vaccine  You may need this if you have certain conditions and were not previously vaccinated. Pneumococcal polysaccharide (PPSV23) vaccine  You may need one or two doses if you smoke cigarettes or if you have certain  conditions. Hepatitis A vaccine  You may need this if you have certain conditions or if you travel or work in places where you may be exposed to hepatitis A. Hepatitis B vaccine  You may need this if you have certain conditions or if you travel or work in places where you may be exposed to hepatitis B. Haemophilus influenzae type b (Hib) vaccine  You may need this if you have certain conditions. You may receive vaccines as individual doses or as more than one vaccine together in one shot (combination vaccines). Talk with your health care provider about the risks and benefits of combination vaccines. What tests do I need?  Blood tests  Lipid and cholesterol levels. These may be checked every 5 years starting at age 43.  Hepatitis C test.  Hepatitis B test. Screening  Diabetes screening. This is done by checking your blood sugar (glucose) after you have not eaten for a while (fasting).  Sexually transmitted disease (STD) testing.  BRCA-related cancer screening. This may  be done if you have a family history of breast, ovarian, tubal, or peritoneal cancers.  Pelvic exam and Pap test. This may be done every 3 years starting at age 32. Starting at age 27, this may be done every 5 years if you have a Pap test in combination with an HPV test. Talk with your health care provider about your test results, treatment options, and if necessary, the need for more tests. Follow these instructions at home: Eating and drinking   Eat a diet that includes fresh fruits and vegetables, whole grains, lean protein, and low-fat dairy.  Take vitamin and mineral supplements as recommended by your health care provider.  Do not drink alcohol if: ? Your health care provider tells you not to drink. ? You are pregnant, may be pregnant, or are planning to become pregnant.  If you drink alcohol: ? Limit how much you have to 0-1 drink a day. ? Be aware of how much alcohol is in your drink. In the [place] one  drink equals one 12 oz bottle of beer (355 mL), one 5 oz glass of wine (148 mL), or one 1 oz glass of hard liquor (44 mL). Lifestyle  Take daily care of your teeth and gums.  Stay active. Exercise for at least 30 minutes on 5 or more days each week.  Do not use any products that contain nicotine or tobacco, such as cigarettes, e-cigarettes, and chewing tobacco. If you need help quitting, ask your health care provider.  If you are sexually active, practice safe sex. Use a condom or other form of birth control (contraception) in order to prevent pregnancy and STIs (sexually transmitted infections). If you plan to become pregnant, see your health care provider for a preconception visit. What's next?  Visit your health care provider once a year for a well check visit.  Ask your health care provider how often you should have your eyes and teeth checked.  Stay up to date on all vaccines. This information is not intended to replace advice given to you by your health care provider. Make sure you discuss any questions you have with your health care provider. Document Released: 07/18/2001 Document Revised: 01/31/2018 Document Reviewed: 01/31/2018 Elsevier Patient Education  2020 [employer].

## 2018-11-30 LAB — COMPREHENSIVE METABOLIC PANEL
ALT: 15 IU/L (ref 0–32)
AST: 20 IU/L (ref 0–40)
Albumin/Globulin Ratio: 1.6 (ref 1.2–2.2)
Albumin: 4.1 g/dL (ref 3.8–4.8)
Alkaline Phosphatase: 77 IU/L (ref 39–117)
BUN/Creatinine Ratio: 21 (ref 9–23)
BUN: 14 mg/dL (ref 6–20)
Bilirubin Total: 0.4 mg/dL (ref 0.0–1.2)
CO2: 20 mmol/L (ref 20–29)
Calcium: 9.1 mg/dL (ref 8.7–10.2)
Chloride: 103 mmol/L (ref 96–106)
Creatinine, Ser: 0.67 mg/dL (ref 0.57–1.00)
GFR calc Af Amer: 131 mL/min/{1.73_m2} (ref >59)
GFR calc non Af Amer: 113 mL/min/{1.73_m2} (ref >59)
Globulin, Total: 2.6 g/dL (ref 1.5–4.5)
Glucose: 79 mg/dL (ref 65–99)
Potassium: 4.4 mmol/L (ref 3.5–5.2)
Sodium: 140 mmol/L (ref 134–144)
Total Protein: 6.7 g/dL (ref 6.0–8.5)

## 2018-11-30 LAB — CBC WITH DIFFERENTIAL/PLATELET
Basophils Absolute: 0 10*3/uL (ref 0.0–0.2)
Basos: 1 %
EOS (ABSOLUTE): 0.2 10*3/uL (ref 0.0–0.4)
Eos: 3 %
Hematocrit: 38.2 % (ref 34.0–46.6)
Hemoglobin: 12.4 g/dL (ref 11.1–15.9)
Immature Grans (Abs): 0 10*3/uL (ref 0.0–0.1)
Immature Granulocytes: 0 %
Lymphocytes Absolute: 2.7 10*3/uL (ref 0.7–3.1)
Lymphs: 35 %
MCH: 28.6 pg (ref 26.6–33.0)
MCHC: 32.5 g/dL (ref 31.5–35.7)
MCV: 88 fL (ref 79–97)
Monocytes Absolute: 0.6 10*3/uL (ref 0.1–0.9)
Monocytes: 7 %
Neutrophils Absolute: 4.2 10*3/uL (ref 1.4–7.0)
Neutrophils: 54 %
Platelets: 318 10*3/uL (ref 150–450)
RBC: 4.33 x10E6/uL (ref 3.77–5.28)
RDW: 12.8 % (ref 11.7–15.4)
WBC: 7.7 10*3/uL (ref 3.4–10.8)

## 2018-11-30 LAB — TSH: TSH: 1.25 u[IU]/mL (ref 0.450–4.500)

## 2018-11-30 LAB — LIPID PANEL
Chol/HDL Ratio: 5 ratio — ABNORMAL HIGH (ref 0.0–4.4)
Cholesterol, Total: 212 mg/dL — ABNORMAL HIGH (ref 100–199)
HDL: 42 mg/dL (ref >39)
LDL Calculated: 149 mg/dL — ABNORMAL HIGH (ref 0–99)
Triglycerides: 104 mg/dL (ref 0–149)
VLDL Cholesterol Cal: 21 mg/dL (ref 5–40)

## 2018-11-30 LAB — HIV ANTIBODY (ROUTINE TESTING W REFLEX): HIV Screen 4th Generation wRfx: NONREACTIVE

## 2018-12-02 ENCOUNTER — Telehealth: Payer: Self-pay | Admitting: Family Medicine

## 2018-12-02 NOTE — Telephone Encounter (Signed)
[facility] cytology dept called - they need for the office to collect another urine sample.  Please call [phone]

## 2018-12-02 NOTE — Telephone Encounter (Signed)
Spoke with pt about dropping off urine sample and she states she will when you get some free time.

## 2019-12-01 ENCOUNTER — Other Ambulatory Visit: Payer: Self-pay

## 2019-12-01 ENCOUNTER — Other Ambulatory Visit (HOSPITAL_COMMUNITY)
Admission: RE | Admit: 2019-12-01 | Discharge: 2019-12-01 | Disposition: A | Discharge status: Home / Self Care | Payer: BC Managed Care – PPO | Source: Ambulatory Visit | Attending: Family Medicine | Admitting: Family Medicine

## 2019-12-01 ENCOUNTER — Encounter: Payer: Self-pay | Admitting: Family Medicine

## 2019-12-01 ENCOUNTER — Ambulatory Visit (INDEPENDENT_AMBULATORY_CARE_PROVIDER_SITE_OTHER): Payer: BC Managed Care – PPO | Admitting: Family Medicine

## 2019-12-01 VITALS — BP 110/78 | HR 77 | Temp 98.6°F | Ht 66.0 in | Wt 151.0 lb

## 2019-12-01 DIAGNOSIS — Z113 Encounter for screening for infections with a predominantly sexual mode of transmission: Secondary | ICD-10-CM | POA: Diagnosis present

## 2019-12-01 DIAGNOSIS — Z0000 Encounter for general adult medical examination without abnormal findings: Secondary | ICD-10-CM

## 2019-12-01 DIAGNOSIS — Z0001 Encounter for general adult medical examination with abnormal findings: Secondary | ICD-10-CM

## 2019-12-01 DIAGNOSIS — E7800 Pure hypercholesterolemia, unspecified: Secondary | ICD-10-CM | POA: Diagnosis not present

## 2019-12-01 NOTE — Patient Instructions (Addendum)
   If you have lab work done today you will be contacted with your lab results within the next 2 weeks.  If you have not heard from us then please contact us. The fastest way to get your results is to register for My Chart.   IF you received an x-ray today, you will receive an invoice from [facility]. Please contact [facility] at [phone] with questions or concerns regarding your invoice.   IF you received labwork today, you will receive an invoice from [facility]. Please contact [facility] at [phone] with questions or concerns regarding your invoice.   Our billing staff will not be able to assist you with questions regarding bills from these companies.  You will be contacted with the lab results as soon as they are available. The fastest way to get your results is to activate your My Chart account. Instructions are located on the last page of this paperwork. If you have not heard from us regarding the results in 2 weeks, please contact this office.     Preventive Care 21-39 Years Old, Female Preventive care refers to visits with your health care provider and lifestyle choices that can promote health and wellness. This includes:  A yearly physical exam. This may also be called an annual well check.  Regular dental visits and eye exams.  Immunizations.  Screening for certain conditions.  Healthy lifestyle choices, such as eating a healthy diet, getting regular exercise, not using drugs or products that contain nicotine and tobacco, and limiting alcohol use. What can I expect for my preventive care visit? Physical exam Your health care provider will check your:  Height and weight. This may be used to calculate body mass index (BMI), which tells if you are at a healthy weight.  Heart rate and blood pressure.  Skin for abnormal spots. Counseling Your health care provider may ask you questions about your:  Alcohol, tobacco, and drug use.  Emotional  well-being.  Home and relationship well-being.  Sexual activity.  Eating habits.  Work and work environment.  Method of birth control.  Menstrual cycle.  Pregnancy history. What immunizations do I need?  Influenza (flu) vaccine  This is recommended every year. Tetanus, diphtheria, and pertussis (Tdap) vaccine  You may need a Td booster every 10 years. Varicella (chickenpox) vaccine  You may need this if you have not been vaccinated. Human papillomavirus (HPV) vaccine  If recommended by your health care provider, you may need three doses over 6 months. Measles, mumps, and rubella (MMR) vaccine  You may need at least one dose of MMR. You may also need a second dose. Meningococcal conjugate (MenACWY) vaccine  One dose is recommended if you are age 19-21 years and a first-year college student living in a residence hall, or if you have one of several medical conditions. You may also need additional booster doses. Pneumococcal conjugate (PCV13) vaccine  You may need this if you have certain conditions and were not previously vaccinated. Pneumococcal polysaccharide (PPSV23) vaccine  You may need one or two doses if you smoke cigarettes or if you have certain conditions. Hepatitis A vaccine  You may need this if you have certain conditions or if you travel or work in places where you may be exposed to hepatitis A. Hepatitis B vaccine  You may need this if you have certain conditions or if you travel or work in places where you may be exposed to hepatitis B. Haemophilus influenzae type b (Hib) vaccine  You   may need this if you have certain conditions. You may receive vaccines as individual doses or as more than one vaccine together in one shot (combination vaccines). Talk with your health care provider about the risks and benefits of combination vaccines. What tests do I need?  Blood tests  Lipid and cholesterol levels. These may be checked every 5 years starting at age  20.  Hepatitis C test.  Hepatitis B test. Screening  Diabetes screening. This is done by checking your blood sugar (glucose) after you have not eaten for a while (fasting).  Sexually transmitted disease (STD) testing.  BRCA-related cancer screening. This may be done if you have a family history of breast, ovarian, tubal, or peritoneal cancers.  Pelvic exam and Pap test. This may be done every 3 years starting at age 21. Starting at age 30, this may be done every 5 years if you have a Pap test in combination with an HPV test. Talk with your health care provider about your test results, treatment options, and if necessary, the need for more tests. Follow these instructions at home: Eating and drinking   Eat a diet that includes fresh fruits and vegetables, whole grains, lean protein, and low-fat dairy.  Take vitamin and mineral supplements as recommended by your health care provider.  Do not drink alcohol if: ? Your health care provider tells you not to drink. ? You are pregnant, may be pregnant, or are planning to become pregnant.  If you drink alcohol: ? Limit how much you have to 0-1 drink a day. ? Be aware of how much alcohol is in your drink. In the [place] one drink equals one 12 oz bottle of beer (355 mL), one 5 oz glass of wine (148 mL), or one 1 oz glass of hard liquor (44 mL). Lifestyle  Take daily care of your teeth and gums.  Stay active. Exercise for at least 30 minutes on 5 or more days each week.  Do not use any products that contain nicotine or tobacco, such as cigarettes, e-cigarettes, and chewing tobacco. If you need help quitting, ask your health care provider.  If you are sexually active, practice safe sex. Use a condom or other form of birth control (contraception) in order to prevent pregnancy and STIs (sexually transmitted infections). If you plan to become pregnant, see your health care provider for a preconception visit. What's next?  Visit your health  care provider once a year for a well check visit.  Ask your health care provider how often you should have your eyes and teeth checked.  Stay up to date on all vaccines. This information is not intended to replace advice given to you by your health care provider. Make sure you discuss any questions you have with your health care provider. Document Revised: 01/31/2018 Document Reviewed: 01/31/2018 Elsevier Patient Education  2020 [employer]  

## 2019-12-01 NOTE — Progress Notes (Signed)
6/28/202111:31 AM  Lori Thompson May 09, 1981, 39 y.o., female [id number]  Chief Complaint  Patient presents with  . Annual Exam  . Gynecologic Exam    HPI:   Patient is a 39 y.o. female who presents today for CPE  Last CPE 2020 G&Ps: 2002 Pap: dec 2017, neg pap and HPV, remote h/o abnormal pap s/p LEEP, normal paps since then STD: requesting today BC : BTL Menses: monthly, regular Mammogram: at age 59 FHX breast/ovarian cancer: maternal aunt ovarian cancer FHx colon cancer: denies Exercise/diet: has not been exercising as much, not eating as healthy Needs to find new dentist Has yearly eye exam, wears contact lenses  Lab Results  Component Value Date   CHOL 212 (H) 11/29/2018   HDL 42 11/29/2018   LDLCALC 149 (H) 11/29/2018   TRIG 104 11/29/2018   CHOLHDL 5.0 (H) 11/29/2018    Most Recent Immunizations  Administered Date(s) Administered  . Tdap 11/29/2018    Depression screen [facility] 2/9 12/01/2019 11/29/2018 05/04/2014  Decreased Interest 0 0 0  Down, Depressed, Hopeless 0 0 0  PHQ - 2 Score 0 0 0    Fall Risk  12/01/2019 11/29/2018 05/04/2014  Falls in the past year? 0 0 No  Number falls in past yr: 0 1 -  Injury with Fall? 0 0 -     No Known Allergies  Prior to Admission medications   Not on File    Past Medical History:  Diagnosis Date  . Abnormal Pap smear of cervix   . Anxiety     Past Surgical History:  Procedure Laterality Date  . CESAREAN SECTION      Social History   Tobacco Use  . Smoking status: Former Smoker    Quit date: 07/2015    Years since quitting: 4.4  . Smokeless tobacco: Never Used  Substance Use Topics  . Alcohol use: Not Currently    Family History  Problem Relation Age of Onset  . Hyperlipidemia Mother   . Stroke Maternal Grandfather   . Heart disease Paternal Grandmother     Review of Systems  Constitutional: Negative for chills and fever.  Respiratory: Negative for cough and shortness of breath.     Cardiovascular: Negative for chest pain, palpitations and leg swelling.  Gastrointestinal: Negative for abdominal pain, blood in stool, constipation, diarrhea, heartburn, melena, nausea and vomiting.  Genitourinary: Negative for dysuria and hematuria.       Neg breast lumps or nipple discharge Neg vaginal discharge, pelvic pain, dyspareunia, abnormal vaginal bleeding  Psychiatric/Behavioral: The patient is nervous/anxious.   All other systems reviewed and are negative. per hpi   OBJECTIVE:  Today's Vitals   12/01/19 1054  BP: 110/78  Pulse: 77  Temp: 98.6 F (37 C)  SpO2: 100%  Weight: 151 lb (68.5 kg)  Height: 5\' 6"  (1.676 m)   Body mass index is 24.37 kg/m.   Hearing Screening   125Hz  250Hz  500Hz  1000Hz  2000Hz  3000Hz  4000Hz  6000Hz  8000Hz   Right ear:           Left ear:             Visual Acuity Screening   Right eye Left eye Both eyes  Without correction:     With correction: 20/20 20/20 20/20     Physical Exam Vitals and nursing note reviewed. Exam conducted with a chaperone present.  Constitutional:      Appearance: She is well-developed.  HENT:     Head: Normocephalic and atraumatic.  Right Ear: Hearing, tympanic membrane, ear canal and external ear normal.     Left Ear: Hearing, tympanic membrane, ear canal and external ear normal.     Mouth/Throat:     Mouth: Mucous membranes are moist.     Pharynx: No oropharyngeal exudate or posterior oropharyngeal erythema.  Eyes:     Extraocular Movements: Extraocular movements intact.     Conjunctiva/sclera: Conjunctivae normal.     Pupils: Pupils are equal, round, and reactive to light.  Neck:     Thyroid: No thyromegaly.  Cardiovascular:     Rate and Rhythm: Normal rate and regular rhythm.     Heart sounds: Normal heart sounds. No murmur heard.  No friction rub. No gallop.   Pulmonary:     Effort: Pulmonary effort is normal.     Breath sounds: Normal breath sounds. No wheezing, rhonchi or rales.  Chest:      Breasts:        Right: No mass, nipple discharge or skin change.        Left: No mass, nipple discharge or skin change.  Abdominal:     General: Bowel sounds are normal. There is no distension.     Palpations: Abdomen is soft. There is no hepatomegaly, splenomegaly or mass.     Tenderness: There is no abdominal tenderness.  Musculoskeletal:        General: Normal range of motion.     Cervical back: Neck supple.     Right lower leg: No edema.     Left lower leg: No edema.  Lymphadenopathy:     Cervical: No cervical adenopathy.     Upper Body:     Right upper body: No supraclavicular, axillary or pectoral adenopathy.     Left upper body: No supraclavicular, axillary or pectoral adenopathy.  Skin:    General: Skin is warm and dry.  Neurological:     Mental Status: She is alert and oriented to person, place, and time.     Cranial Nerves: No cranial nerve deficit.     Gait: Gait normal.     Deep Tendon Reflexes: Reflexes are normal and symmetric.  Psychiatric:        Mood and Affect: Mood normal.        Behavior: Behavior normal.      No results found for this or any previous visit (from the past 24 hour(s)).  No results found.   ASSESSMENT and PLAN  1. Annual physical exam No concerns per history or exam. Routine HCM labs ordered. HCM reviewed/discussed. Anticipatory guidance regarding healthy weight, lifestyle and choices given.   2. Screen for STD (sexually transmitted disease) - HIV Antibody (routine testing w rflx) - Hepatitis C antibody - RPR - Urine cytology ancillary only  3. Elevated cholesterol Discussed LFM - Lipid panel  Return in about 1 year (around 11/30/2020).    Myles Lipps, MD Primary Care at [facility] [address] [place], [place] [address] Ph.  [phone] Fax [phone]

## 2019-12-02 LAB — LIPID PANEL
Chol/HDL Ratio: 6.5 ratio — ABNORMAL HIGH (ref 0.0–4.4)
Cholesterol, Total: 221 mg/dL — ABNORMAL HIGH (ref 100–199)
HDL: 34 mg/dL — ABNORMAL LOW (ref >39)
LDL Chol Calc (NIH): 155 mg/dL — ABNORMAL HIGH (ref 0–99)
Triglycerides: 174 mg/dL — ABNORMAL HIGH (ref 0–149)
VLDL Cholesterol Cal: 32 mg/dL (ref 5–40)

## 2019-12-02 LAB — URINE CYTOLOGY ANCILLARY ONLY
Chlamydia: NEGATIVE
Comment: NEGATIVE
Comment: NEGATIVE
Comment: NORMAL
Neisseria Gonorrhea: NEGATIVE
Trichomonas: NEGATIVE

## 2019-12-02 LAB — HIV ANTIBODY (ROUTINE TESTING W REFLEX): HIV Screen 4th Generation wRfx: NONREACTIVE

## 2019-12-02 LAB — RPR: RPR Ser Ql: NONREACTIVE

## 2019-12-02 LAB — HEPATITIS C ANTIBODY: Hep C Virus Ab: <0.1 s/co ratio (ref 0.0–0.9)

## 2020-06-24 ENCOUNTER — Encounter: Payer: Self-pay | Admitting: Family Medicine

## 2020-06-24 ENCOUNTER — Other Ambulatory Visit: Payer: Self-pay

## 2020-06-24 ENCOUNTER — Ambulatory Visit (INDEPENDENT_AMBULATORY_CARE_PROVIDER_SITE_OTHER): Payer: BC Managed Care – PPO | Admitting: Family Medicine

## 2020-06-24 VITALS — BP 113/72 | HR 83 | Temp 97.9°F | Ht 66.0 in | Wt 146.0 lb

## 2020-06-24 DIAGNOSIS — N6310 Unspecified lump in the right breast, unspecified quadrant: Secondary | ICD-10-CM | POA: Diagnosis not present

## 2020-06-24 NOTE — Progress Notes (Addendum)
1/20/202210:27 AM  Lori Thompson [birth year], 40 y.o., female [id number]  Chief Complaint  Patient presents with  . Lump on R breast    First noticed 2 days ago, reports pain to touch as well throbbing sensation. Has hx of irregular periods    HPI:   Patient is a 40 y.o. female with no significant past medical history who presents today for breast pain.  Noticed a breast lump a couple of days ago Never had this in the past Feels throbbing Has 2 kids 10 plus years ago Denies family history of breast cancer  History of irregular periods Tubes are tied Not on birth control Denies nipple discharge or changes to nipple No issues with left breast Nipple piercing in place  Depression screen [facility] 2/9 12/01/2019 11/29/2018 05/04/2014  Decreased Interest 0 0 0  Down, Depressed, Hopeless 0 0 0  PHQ - 2 Score 0 0 0    Fall Risk  12/01/2019 11/29/2018 05/04/2014  Falls in the past year? 0 0 No  Number falls in past yr: 0 1 -  Injury with Fall? 0 0 -     No Known Allergies  Prior to Admission medications   Not on File    Past Medical History:  Diagnosis Date  . Abnormal Pap smear of cervix   . Anxiety     Past Surgical History:  Procedure Laterality Date  . CESAREAN SECTION  2011,2009  . TUBAL LIGATION  07/2009    Social History   Tobacco Use  . Smoking status: Former Smoker    Quit date: 07/2015    Years since quitting: 4.9  . Smokeless tobacco: Never Used  Substance Use Topics  . Alcohol use: Not Currently    Family History  Problem Relation Age of Onset  . Hyperlipidemia Mother   . Hypertension Mother   . Stroke Maternal Grandfather   . Heart disease Paternal Grandmother     Review of Systems  Constitutional: Negative for chills, fever and malaise/fatigue.  Eyes: Negative for blurred vision and double vision.  Respiratory: Negative for cough, shortness of breath and wheezing.   Cardiovascular: Negative for chest pain, palpitations and leg  swelling.  Gastrointestinal: Negative for abdominal pain, blood in stool, constipation, diarrhea, heartburn, nausea and vomiting.  Genitourinary: Negative for dysuria, frequency and hematuria.  Musculoskeletal: Negative for back pain and joint pain.  Skin: Negative for rash.       Right breast lump and tenderness  Neurological: Negative for dizziness, weakness and headaches.     OBJECTIVE:  Today's Vitals   06/24/20 1006  BP: 113/72  Pulse: 83  Temp: 97.9 F (36.6 C)  SpO2: 99%  Weight: 146 lb (66.2 kg)  Height: 5\' 6"  (1.676 m)   Body mass index is 23.57 kg/m.   Physical Exam Constitutional:      General: She is not in acute distress.    Appearance: Normal appearance. She is not ill-appearing.  HENT:     Head: Normocephalic.  Cardiovascular:     Rate and Rhythm: Normal rate and regular rhythm.     Pulses: Normal pulses.     Heart sounds: Normal heart sounds. No murmur heard. No friction rub. No gallop.   Pulmonary:     Effort: Pulmonary effort is normal. No respiratory distress.     Breath sounds: Normal breath sounds. No stridor. No wheezing, rhonchi or rales.  Chest:  Breasts:     Right: Mass and tenderness present. No swelling, bleeding, inverted nipple, nipple  discharge or skin change.     Left: Normal.     Abdominal:     General: Bowel sounds are normal.     Palpations: Abdomen is soft.     Tenderness: There is no abdominal tenderness.  Musculoskeletal:     Right lower leg: No edema.     Left lower leg: No edema.  Skin:    General: Skin is warm and dry.  Neurological:     Mental Status: She is alert and oriented to person, place, and time.  Psychiatric:        Mood and Affect: Mood normal.        Behavior: Behavior normal.     No results found for this or any previous visit (from the past 24 hour(s)).  No results found.   ASSESSMENT and PLAN  Problem List Items Addressed This Visit   None   Visit Diagnoses    Breast mass, right    -   Primary   Relevant Orders   US BREAST LTD UNI RIGHT INC AXILLA   MS DIGITAL SCREENING TOMO BILATERAL       Plan . Follow up with imaging from breast center   Return if symptoms worsen or fail to improve.    Macario Carls Sable Knoles, FNP-BC Primary Care at [facility] [address] [place], [place] [address] Ph.  [phone] Fax [phone]  I have reviewed and agree with above documentation. Edwina Barth, MD

## 2020-06-24 NOTE — Patient Instructions (Addendum)
Breast Cyst  A breast cyst is a sac in the breast that is filled with fluid. They are usually noncancerous (benign) and are common among women. Breast cysts are most often in the upper, outer portion of the breast. One or more cysts may develop. They form when fluid builds up inside the breast glands. There are several types of breast cysts. Some are too small to feel, but these can be seen with imaging tests such as an X-ray of the breast (mammogram) or ultrasound. Breast cysts do not increase your risk of breast cancer. They usually disappear after you no longer have a menstrual cycle (after menopause), unless you take artificial hormones (are on hormone therapy). What are the causes? This condition may be caused by:  Blockage of tubes (ducts) in the breast glands, which leads to fluid buildup. Duct blockage may result from: ? Fibrocystic breast changes. This is a common, benign condition that occurs when women go through hormonal changes during the menstrual cycle. This is a common cause of multiple breast cysts. ? Overgrowth of breast tissue or breast glands. ? Scar tissue in the breast from previous surgery.  Changes in certain female hormones (estrogen and progesterone). The exact cause of this condition is not known. What increases the risk? You may be more likely to develop breast cysts if you have not gone through menopause. What are the signs or symptoms? Symptoms of this condition include:  Feeling one or more smooth, round, soft lumps (like grapes) in the breast that are easily movable. The lump or lumps may get bigger and more painful before your menstrual period and get smaller after your menstrual period.  Breast discomfort or pain. How is this diagnosed? This condition may be diagnosed based on:  A physical exam. A cyst can be felt by your health care provider during this exam.  Imaging tests, such as mammogram or ultrasound. Fluid may be removed from the cyst with a  needle (fine-needle aspiration) and tested to make sure the cyst is not cancerous. How is this treated? Treatment may not be needed for this condition. Your health care provider may monitor the cyst to see if it goes away on its own. If the cyst is uncomfortable or gets bigger, or if you do not like how the cyst makes your breast look, you may need treatment. Treatment may include:  Hormone therapy.  Fine-needle aspiration to drain fluid from the cyst. There is a chance of the cyst coming back (recurring) after aspiration.  Surgery to remove the cyst. Follow these instructions at home: Self-exams  Do a breast self-exam every month, or as often as directed. A breast self-exam involves: ? Comparing your breasts in the mirror. ? Looking for visible changes in your skin or nipples. ? Feeling for lumps or changes.  Having many breast cysts may make it harder to feel for new lumps. Understand how your breasts normally look and feel, and write down any changes in your breasts. Tell your health care provider about any changes.   Eating and drinking  Follow instructions from your health care provider about eating and drinking restrictions.  Drink enough fluid to keep your urine pale yellow.  Avoid caffeine.  Cut down on salt (sodium) in what you eat and drink, especially before your menstrual period. Too much sodium can cause fluid buildup, breast swelling, and discomfort. General instructions  See your health care provider regularly. ? Get a yearly physical exam. ? If you are 75-54 years old,  get a clinical breast exam every 1-3 years. After the age of 40 years, get this exam every year. ? Get mammograms as often as directed.  Take over-the-counter and prescription medicines only as told by your health care provider.  Wear a supportive bra, especially when exercising.  Keep all follow-up visits as told by your health care provider. This is important. Contact a health care provider  if:  You feel, or think you feel, a lump in your breast.  You notice that both breasts look or feel different than usual.  Your breast is still causing pain after your menstrual period is over.  You find new lumps or bumps that were not there before.  You feel lumps in your armpit. Get help right away if:  You have severe pain, tenderness, redness, or warmth in your breast.  You have fluid or blood leaking from your nipple.  Your breast lump becomes hard and painful.  You notice dimpling or wrinkling of the breast or nipple. Summary  A breast cyst is a sac in the breast that is filled with fluid.  Treatment may not be needed for this condition.  If the cyst is uncomfortable or gets bigger, or if you do not like how the cyst makes your breast look, you may need treatment. This information is not intended to replace advice given to you by your health care provider. Make sure you discuss any questions you have with your health care provider. Document Revised: 10/08/2018 Document Reviewed: 10/08/2018 Elsevier Patient Education  2021 [employer].    If you have lab work done today you will be contacted with your lab results within the next 2 weeks.  If you have not heard from [place] then please contact us. The fastest way to get your results is to register for My Chart.   IF you received an x-ray today, you will receive an invoice from [facility]. Please contact [facility] at [phone] with questions or concerns regarding your invoice.   IF you received labwork today, you will receive an invoice from [facility]. Please contact [facility] at [phone] with questions or concerns regarding your invoice.   Our billing staff will not be able to assist you with questions regarding bills from these companies.  You will be contacted with the lab results as soon as they are available. The fastest way to get your results is to activate your My Chart account.  Instructions are located on the last page of this paperwork. If you have not heard from [place] regarding the results in 2 weeks, please contact this office.

## 2020-08-13 ENCOUNTER — Ambulatory Visit
Admission: RE | Admit: 2020-08-13 | Discharge: 2020-08-13 | Disposition: A | Discharge status: Home / Self Care | Payer: BC Managed Care – PPO | Source: Ambulatory Visit | Attending: Family Medicine | Admitting: Family Medicine

## 2020-08-13 ENCOUNTER — Other Ambulatory Visit: Payer: Self-pay

## 2020-08-13 ENCOUNTER — Ambulatory Visit
Admission: RE | Admit: 2020-08-13 | Discharge: 2020-08-13 | Disposition: A | Discharge status: Home / Self Care | Payer: BLUE CROSS/BLUE SHIELD | Source: Ambulatory Visit | Attending: Family Medicine | Admitting: Family Medicine

## 2020-08-13 ENCOUNTER — Other Ambulatory Visit: Payer: Self-pay | Admitting: Family Medicine

## 2020-08-13 DIAGNOSIS — R928 Other abnormal and inconclusive findings on diagnostic imaging of breast: Secondary | ICD-10-CM

## 2020-08-13 DIAGNOSIS — N6310 Unspecified lump in the right breast, unspecified quadrant: Secondary | ICD-10-CM

## 2020-12-01 ENCOUNTER — Encounter: Payer: BC Managed Care – PPO | Admitting: Family Medicine

## 2020-12-02 ENCOUNTER — Other Ambulatory Visit: Payer: Self-pay

## 2020-12-02 ENCOUNTER — Ambulatory Visit: Payer: BC Managed Care – PPO | Admitting: Internal Medicine

## 2020-12-02 ENCOUNTER — Encounter: Payer: Self-pay | Admitting: Internal Medicine

## 2020-12-02 VITALS — BP 102/70 | HR 51 | Temp 98.2°F | Resp 16 | Ht 62.0 in | Wt 149.2 lb

## 2020-12-02 DIAGNOSIS — Z124 Encounter for screening for malignant neoplasm of cervix: Secondary | ICD-10-CM | POA: Insufficient documentation

## 2020-12-02 DIAGNOSIS — R001 Bradycardia, unspecified: Secondary | ICD-10-CM

## 2020-12-02 DIAGNOSIS — Z0001 Encounter for general adult medical examination with abnormal findings: Secondary | ICD-10-CM

## 2020-12-02 LAB — LIPID PANEL
Cholesterol: 225 mg/dL — ABNORMAL HIGH (ref 0–200)
HDL: 41.9 mg/dL (ref >39.00)
LDL Cholesterol: 161 mg/dL — ABNORMAL HIGH (ref 0–99)
NonHDL: 182.76
Total CHOL/HDL Ratio: 5
Triglycerides: 109 mg/dL (ref 0.0–149.0)
VLDL: 21.8 mg/dL (ref 0.0–40.0)

## 2020-12-02 LAB — CBC WITH DIFFERENTIAL/PLATELET
Basophils Absolute: 0 10*3/uL (ref 0.0–0.1)
Basophils Relative: 0.7 % (ref 0.0–3.0)
Eosinophils Absolute: 0.2 10*3/uL (ref 0.0–0.7)
Eosinophils Relative: 3.4 % (ref 0.0–5.0)
HCT: 39.5 % (ref 36.0–46.0)
Hemoglobin: 13.2 g/dL (ref 12.0–15.0)
Lymphocytes Relative: 43.6 % (ref 12.0–46.0)
Lymphs Abs: 2.9 10*3/uL (ref 0.7–4.0)
MCHC: 33.3 g/dL (ref 30.0–36.0)
MCV: 86.8 fl (ref 78.0–100.0)
Monocytes Absolute: 0.4 10*3/uL (ref 0.1–1.0)
Monocytes Relative: 6.7 % (ref 3.0–12.0)
Neutro Abs: 3 10*3/uL (ref 1.4–7.7)
Neutrophils Relative %: 45.6 % (ref 43.0–77.0)
Platelets: 339 10*3/uL (ref 150.0–400.0)
RBC: 4.55 Mil/uL (ref 3.87–5.11)
RDW: 12.7 % (ref 11.5–15.5)
WBC: 6.6 10*3/uL (ref 4.0–10.5)

## 2020-12-02 LAB — BASIC METABOLIC PANEL
BUN: 11 mg/dL (ref 6–23)
CO2: 28 mEq/L (ref 19–32)
Calcium: 9.1 mg/dL (ref 8.4–10.5)
Chloride: 104 mEq/L (ref 96–112)
Creatinine, Ser: 0.61 mg/dL (ref 0.40–1.20)
GFR: 113.13 mL/min (ref >60.00)
Glucose, Bld: 80 mg/dL (ref 70–99)
Potassium: 4.1 mEq/L (ref 3.5–5.1)
Sodium: 138 mEq/L (ref 135–145)

## 2020-12-02 NOTE — Patient Instructions (Signed)
Bradycardia, Adult Bradycardia is a slower-than-normal heartbeat. A normal resting heart rate for an adult ranges from 60 to 100 beats per minute. With bradycardia, the restingheart rate is less than 60 beats per minute. Bradycardia can prevent enough oxygen from reaching certain areas of your body when you are active. It can be serious if it keeps enough oxygen from reaching your brain and other parts of your body. Bradycardia is not a problem foreveryone. For some healthy adults, a slow resting heart rate is normal. What are the causes? This condition may be caused by: A problem with the heart, including: A problem with the heart's electrical system, such as a heart block. With a heart block, electrical signals between the chambers of the heart are partially or completely blocked, so they are not able to work as they should. A problem with the heart's natural pacemaker (sinus node). Heart disease. A heart attack. Heart damage. Lyme disease. A heart infection. A heart condition that is present at birth (congenital heart defect). Certain medicines that treat heart conditions. Certain conditions, such as hypothyroidism and obstructive sleep apnea. Problems with the balance of chemicals and other substances, like potassium, in the blood. Trauma. Radiation therapy. What increases the risk? You are more likely to develop this condition if you: Are age 65 or older. Have high blood pressure (hypertension), high cholesterol (hyperlipidemia), or diabetes. Drink heavily, use tobacco or nicotine products, or use drugs. What are the signs or symptoms? Symptoms of this condition include: Light-headedness. Feeling faint or fainting. Fatigue and weakness. Trouble with activity or exercise. Shortness of breath. Chest pain (angina). Drowsiness. Confusion. Dizziness. How is this diagnosed? This condition may be diagnosed based on: Your symptoms. Your medical history. A physical exam. During  the exam, your health care provider will listen to your heartbeat and check your pulse. To confirm the diagnosis, your health care provider may order tests, such as: Blood tests. An electrocardiogram (ECG). This test records the heart's electrical activity. The test can show how fast your heart is beating and whether the heartbeat is steady. A test in which you wear a portable device (event recorder or Holter monitor) to record your heart's electrical activity while you go about your day. An exercise test. How is this treated? Treatment for this condition depends on the cause of the condition and how severe your symptoms are. Treatment may involve: Treatment of the underlying condition. Changing your medicines or how much medicine you take. Having a small, battery-operated device called a pacemaker implanted under the skin. When bradycardia occurs, this device can be used to increase your heart rate and help your heart beat in a regular rhythm. Follow these instructions at home: Lifestyle  Manage any health conditions that contribute to bradycardia as told by your health care provider. Follow a heart-healthy diet. A nutrition specialist (dietitian) can help educate you about healthy food options and changes. Follow an exercise program that is approved by your health care provider. Maintain a healthy weight. Try to reduce or manage your stress, such as with yoga or meditation. If you need help reducing stress, ask your health care provider. Do not use any products that contain nicotine or tobacco, such as cigarettes, e-cigarettes, and chewing tobacco. If you need help quitting, ask your health care provider. Do not use illegal drugs. Limit alcohol intake to no more than 1 drink a day for nonpregnant women and 2 drinks a day for men. Be aware of how much alcohol is in your drink. In   the [place] one drink equals one 12 oz bottle of beer (355 mL), one 5 oz glass of wine (148 mL), or one 1 oz glass of  hard liquor (44 mL).  General instructions Take over-the-counter and prescription medicines only as told by your health care provider. Keep all follow-up visits as told by your health care provider. This is important. How is this prevented? In some cases, bradycardia may be prevented by: Treating underlying medical problems. Stopping behaviors or medicines that can trigger the condition. Contact a health care provider if you: Feel light-headed or dizzy. Almost faint. Feel weak or are easily fatigued during physical activity. Experience confusion or have memory problems. Get help right away if: You faint. You have: An irregular heartbeat (palpitations). Chest pain. Trouble breathing. Summary Bradycardia is a slower-than-normal heartbeat. With bradycardia, the resting heart rate is less than 60 beats per minute. Treatment for this condition depends on the cause. Manage any health conditions that contribute to bradycardia as told by your health care provider. Do not use any products that contain nicotine or tobacco, such as cigarettes, e-cigarettes, and chewing tobacco, and limit alcohol intake. Keep all follow-up visits as told by your health care provider. This is important. This information is not intended to replace advice given to you by your health care provider. Make sure you discuss any questions you have with your healthcare provider. Document Revised: 12/03/2017 Document Reviewed: 10/31/2017 Elsevier Patient Education  2022 [employer]  

## 2020-12-02 NOTE — Progress Notes (Signed)
Subjective:  Patient ID: Lori Thompson, female    DOB: 1981-01-26  Age: 40 y.o. MRN: [id number]  CC: New Patient (Initial Visit) and Annual Exam  This visit occurred during the SARS-CoV-2 public health emergency.  Safety protocols were in place, including screening questions prior to the visit, additional usage of staff PPE, and extensive cleaning of exam room while observing appropriate contact time as indicated for disinfecting solutions.    HPI Lori Thompson presents for a CPX and to establish.  She has a longstanding history of bradycardia.  She is active and denies any recent episodes of chest pain, shortness of breath, presyncope, syncope, dizziness, lightheadedness, or fatigue.  History Lori Thompson has a past medical history of Abnormal Pap smear of cervix and Anxiety.   She has a past surgical history that includes Tubal ligation (07/2009) and Cesarean section (2011,2009).   Her family history includes Heart disease in her paternal grandmother; Hyperlipidemia in her mother; Hypertension in her mother; Stroke in her maternal grandfather.She reports that she quit smoking about 5 years ago. Her smoking use included cigarettes. She has never used smokeless tobacco. She reports previous alcohol use. She reports that she does not use drugs.  No outpatient medications prior to visit.   No facility-administered medications prior to visit.    ROS Review of Systems  Constitutional:  Negative for diaphoresis and fatigue.  HENT: Negative.    Eyes: Negative.   Respiratory:  Negative for chest tightness, shortness of breath and wheezing.   Cardiovascular:  Negative for chest pain, palpitations and leg swelling.  Gastrointestinal:  Negative for abdominal pain, diarrhea, nausea and vomiting.  Endocrine: Negative.  Negative for cold intolerance and heat intolerance.  Genitourinary: Negative.   Musculoskeletal: Negative.   Skin: Negative.   Neurological: Negative.  Negative for  dizziness, syncope, weakness and light-headedness.  Hematological:  Negative for adenopathy. Does not bruise/bleed easily.   Objective:  BP 102/70 (BP Location: Right Arm, Patient Position: Sitting, Cuff Size: Large)   Pulse (!) 51   Temp 98.2 F (36.8 C) (Oral)   Resp 16   Ht 5\' 2"  (1.575 m)   Wt 149 lb 3.2 oz (67.7 kg)   LMP 11/22/2020 (Exact Date) Comment: BTL  SpO2 100%   BMI 27.29 kg/m   Physical Exam Vitals reviewed.  HENT:     Nose: Nose normal.  Eyes:     General: No scleral icterus.    Conjunctiva/sclera: Conjunctivae normal.  Cardiovascular:     Rate and Rhythm: Regular rhythm. Bradycardia present.     Heart sounds: Normal heart sounds, S1 normal and S2 normal. No murmur heard.   No gallop.     Comments: EKG- Sinus bradycardia, 52 bpm Otherwise normal EKG Pulmonary:     Effort: Pulmonary effort is normal.     Breath sounds: No stridor. No wheezing, rhonchi or rales.  Abdominal:     General: Abdomen is flat.     Palpations: There is no mass.     Tenderness: There is no abdominal tenderness. There is no guarding.  Musculoskeletal:     Cervical back: Neck supple.     Right lower leg: No edema.     Left lower leg: No edema.  Skin:    General: Skin is warm and dry.  Neurological:     General: No focal deficit present.     Mental Status: She is alert.  Psychiatric:        Mood and Affect: Mood normal.  Behavior: Behavior normal.    Lab Results  Component Value Date   WBC 6.6 12/02/2020   HGB 13.2 12/02/2020   HCT 39.5 12/02/2020   PLT 339.0 12/02/2020   GLUCOSE 80 12/02/2020   CHOL 225 (H) 12/02/2020   TRIG 109.0 12/02/2020   HDL 41.90 12/02/2020   LDLCALC 161 (H) 12/02/2020   ALT 15 11/29/2018   AST 20 11/29/2018   NA 138 12/02/2020   K 4.1 12/02/2020   CL 104 12/02/2020   CREATININE 0.61 12/02/2020   BUN 11 12/02/2020   CO2 28 12/02/2020   TSH 1.29 12/02/2020     Assessment & Plan:   Lori Thompson was seen today for new patient  (initial visit) and annual exam.  Diagnoses and all orders for this visit:  Chronic sinus bradycardia- She has longstanding sinus bradycardia.  She is asymptomatic with this.  Her EKG is reassuring.  Labs are negative for secondary causes. -     CBC with Differential/Platelet; Future -     Basic metabolic panel; Future -     Thyroid Panel With TSH; Future -     EKG 12-Lead -     Thyroid Panel With TSH -     Basic metabolic panel -     CBC with Differential/Platelet  Encounter for general adult medical examination with abnormal findings- Exam completed, labs reviewed, vaccines reviewed and updated, cancer screenings addressed, patient education was given. -     Lipid panel; Future -     Lipid panel  Cervical cancer screening -     Ambulatory referral to Gynecology  Lori Thompson does not currently have medications on file.  No orders of the defined types were placed in this encounter.    Follow-up: Return in about 6 months (around 06/03/2021).  Sanda Linger, MD

## 2020-12-03 ENCOUNTER — Encounter: Payer: BC Managed Care – PPO | Admitting: Family Medicine

## 2020-12-03 ENCOUNTER — Encounter: Payer: Self-pay | Admitting: Internal Medicine

## 2020-12-03 LAB — THYROID PANEL WITH TSH
Free Thyroxine Index: 3.1 (ref 1.4–3.8)
T3 Uptake: 27 % (ref 22–35)
T4, Total: 11.3 ug/dL (ref 5.1–11.9)
TSH: 1.29 mIU/L

## 2022-02-05 IMAGING — US US BREAST*R* LIMITED INC AXILLA
1 series · 5 of 5 positions shown · non-contrast
Comparison: Previous exam(s).
COMPARISON: None

*** End of Addendum ***
COMPARISON: Previous exam(s).

Addendum:
CLINICAL DATA: Palpable lump in the right breast.

EXAM:
DIGITAL DIAGNOSTIC BILATERAL MAMMOGRAM WITH TOMOSYNTHESIS AND CAD;
ULTRASOUND LEFT BREAST LIMITED; ULTRASOUND RIGHT BREAST LIMITED
TECHNIQUE: Bilateral digital diagnostic mammography and breast tomosynthesis
was performed. The images were evaluated with computer-aided
detection.; Targeted ultrasound examination of the left breast was
performed; Targeted ultrasound examination of the right breast was
performed

[Series 1: us breast*right* limited inc axilla · 0.07mm/px · 5 of 5 slices shown]
[im 1/5]
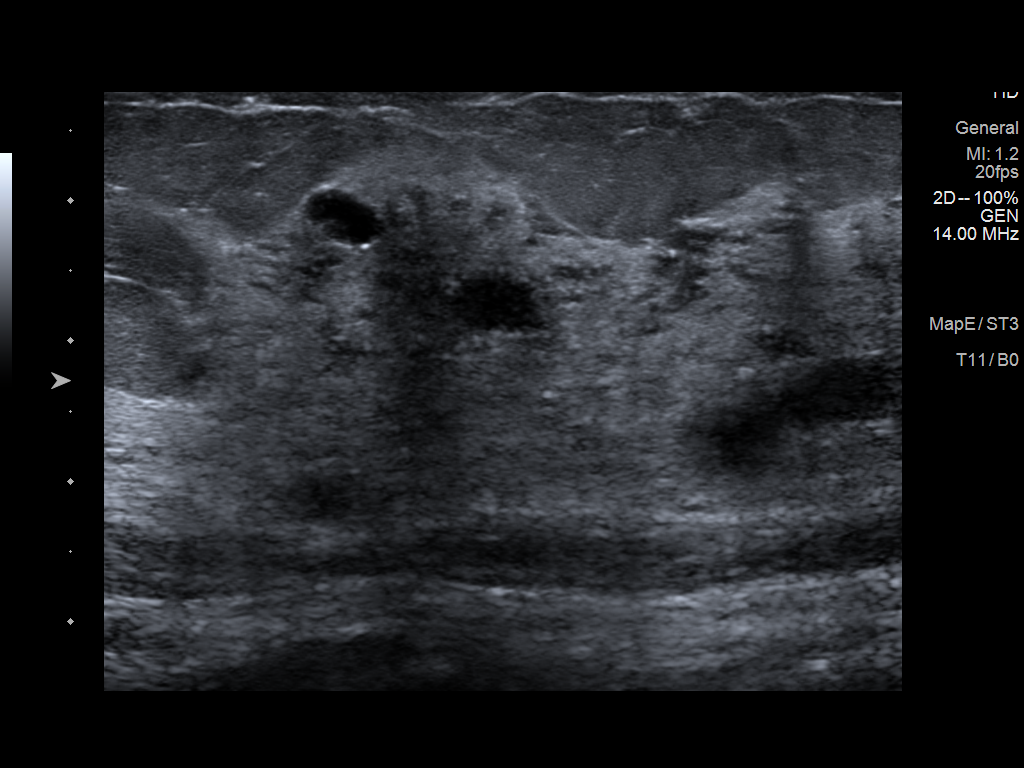
[im 2/5]
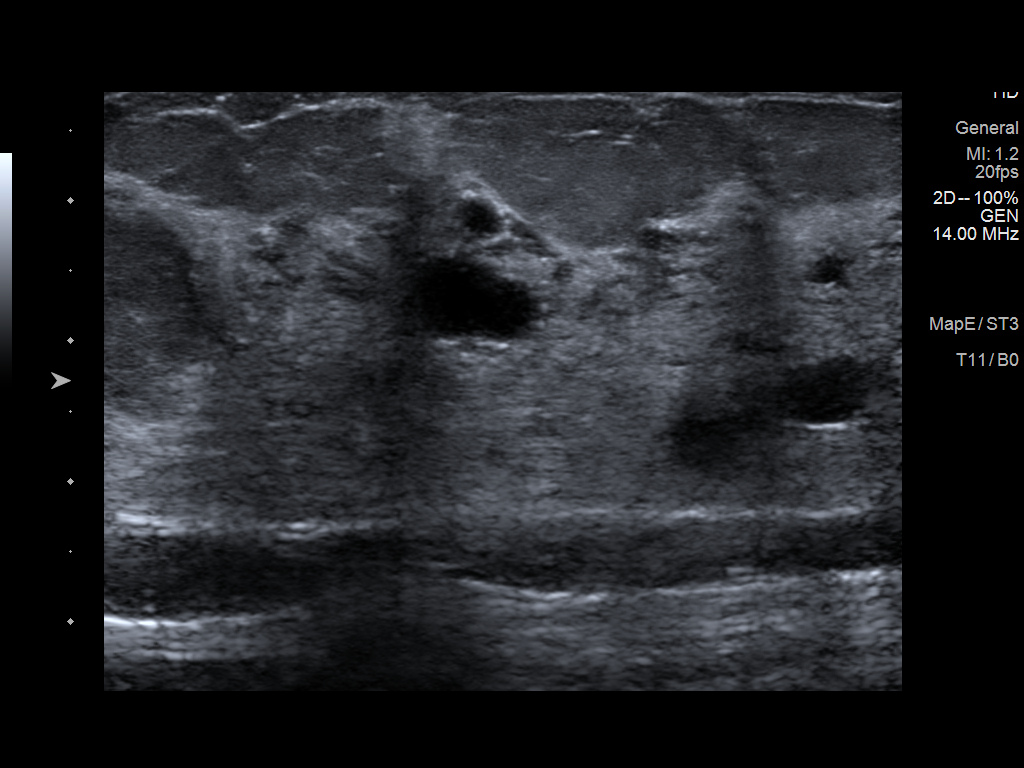
[im 3/5]
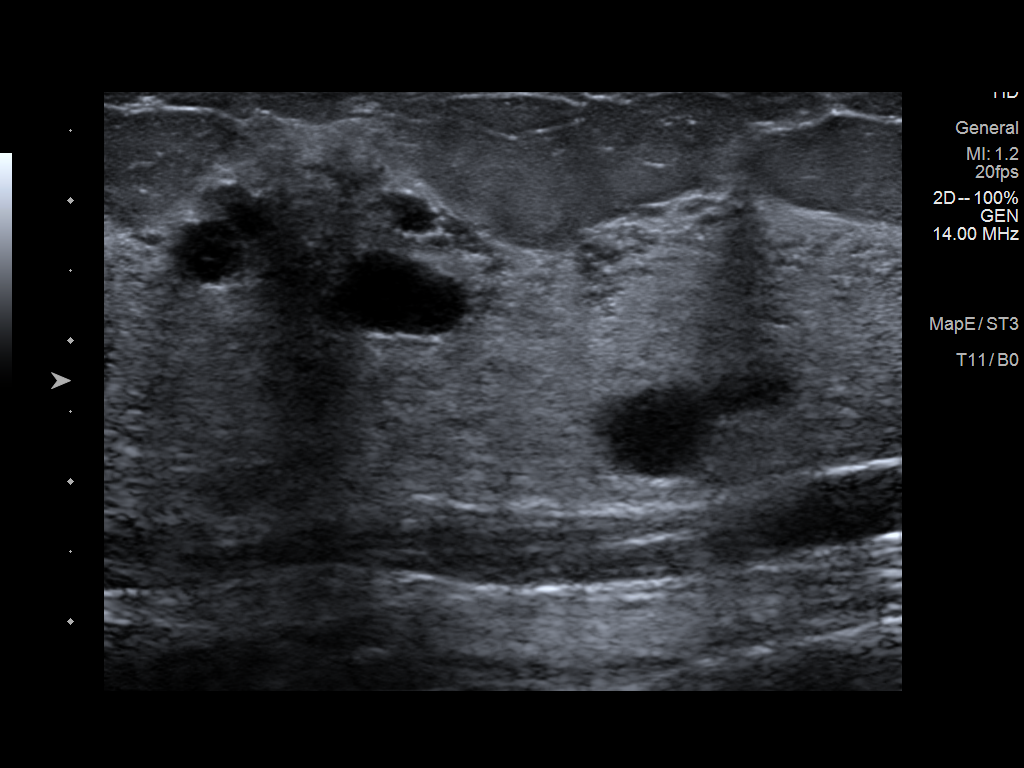
[im 4/5]
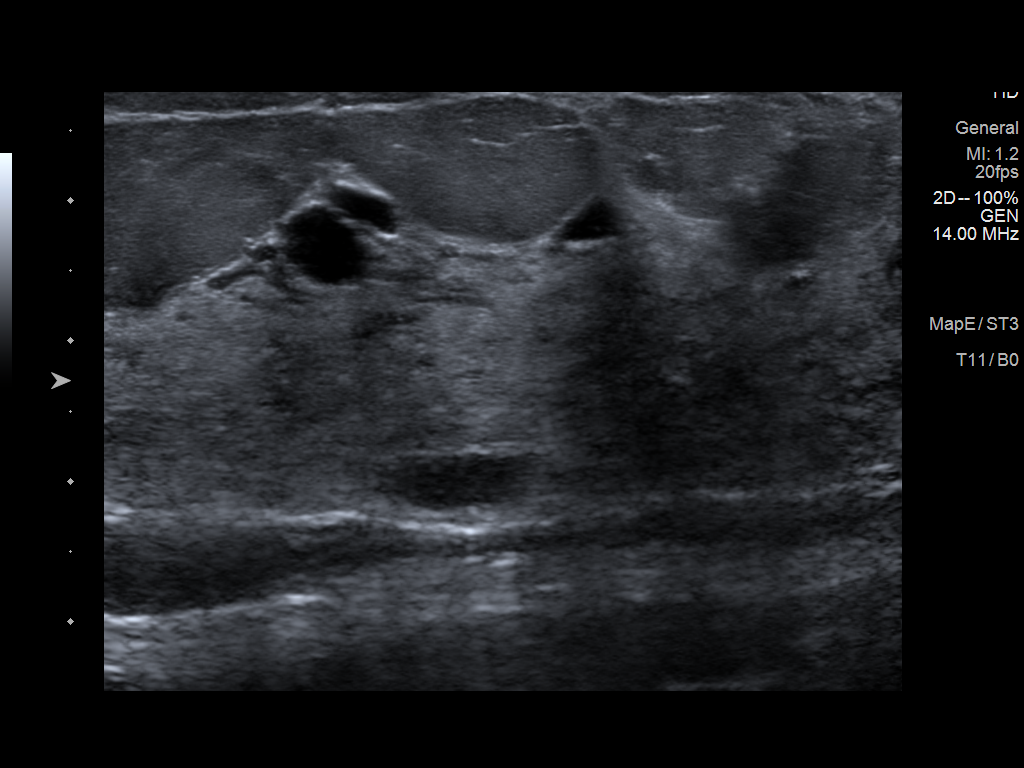
[im 5/5]
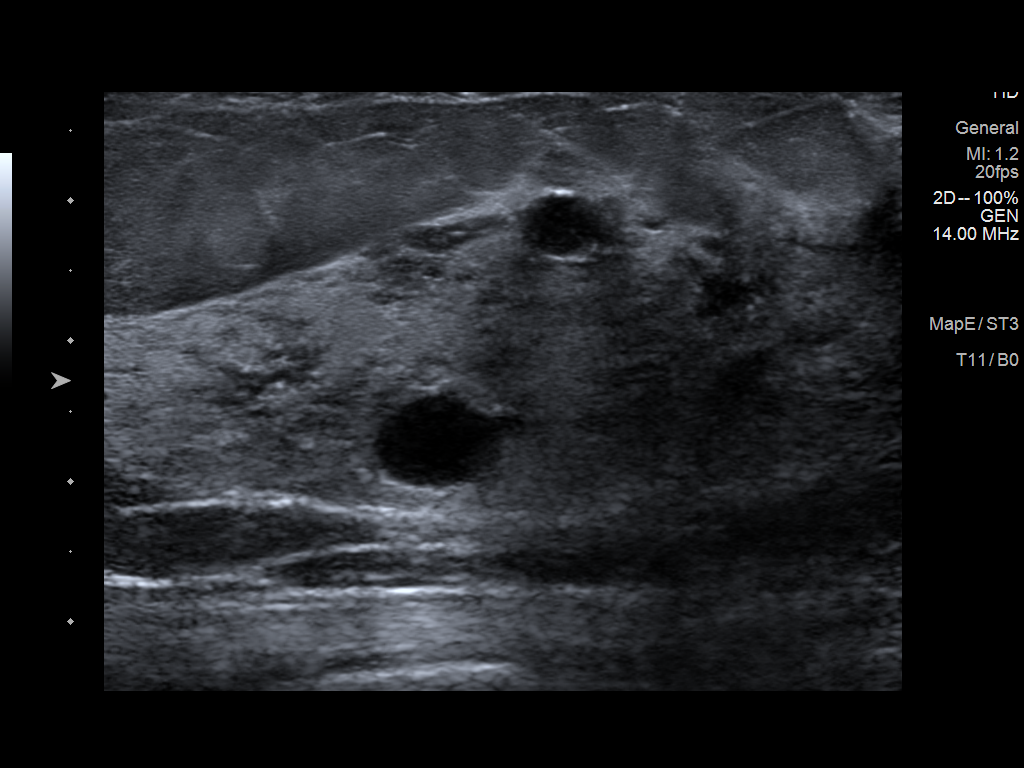

[5 of 5 positions shown; findings below may reference images not displayed]

ACR Breast Density Category c: The breast tissue is heterogeneously
dense, which may obscure small masses.
FINDINGS: Multiple obscured masses are seen bilaterally. There is an obscured
mass in the region of the patient's palpable lump. No suspicious
mammographic findings.

On physical exam, no suspicious lumps are identified.

Targeted ultrasound is performed, showing bilateral fibrocystic
changes accounting for the mammographic findings.
IMPRESSION: Fibrocystic changes.  No evidence of malignancy.

RECOMMENDATION:
Annual screening mammography beginning at the age of 40.

I have discussed the findings and recommendations with the patient.
If applicable, a reminder letter will be sent to the patient
regarding the next appointment.

BI-RADS CATEGORY  2: Benign.
ACR Breast Density Category c: The breast tissue is heterogeneously
dense, which may obscure small masses.
FINDINGS: Multiple obscured masses are seen bilaterally. There is an obscured
mass in the region of the patient's palpable lump. No suspicious
mammographic findings.

On physical exam, no suspicious lumps are identified.

Targeted ultrasound is performed, showing bilateral fibrocystic
changes accounting for the mammographic findings.
IMPRESSION: Fibrocystic changes.  No evidence of malignancy.

RECOMMENDATION:
Annual screening mammography beginning at the age of 40.

I have discussed the findings and recommendations with the patient.
If applicable, a reminder letter will be sent to the patient
regarding the next appointment.

BI-RADS CATEGORY  2: Benign.

## 2022-02-05 IMAGING — MG DIGITAL DIAGNOSTIC BILAT W/ TOMO W/ CAD
6 of 10 series · 6 of 30 positions shown · non-contrast
Comparison: Previous exam(s).
COMPARISON: None

*** End of Addendum ***
COMPARISON: Previous exam(s).

Addendum:
CLINICAL DATA: Palpable lump in the right breast.

EXAM:
DIGITAL DIAGNOSTIC BILATERAL MAMMOGRAM WITH TOMOSYNTHESIS AND CAD;
ULTRASOUND LEFT BREAST LIMITED; ULTRASOUND RIGHT BREAST LIMITED
TECHNIQUE: Bilateral digital diagnostic mammography and breast tomosynthesis
was performed. The images were evaluated with computer-aided
detection.; Targeted ultrasound examination of the left breast was
performed; Targeted ultrasound examination of the right breast was
performed

[R MLO synth-2D]
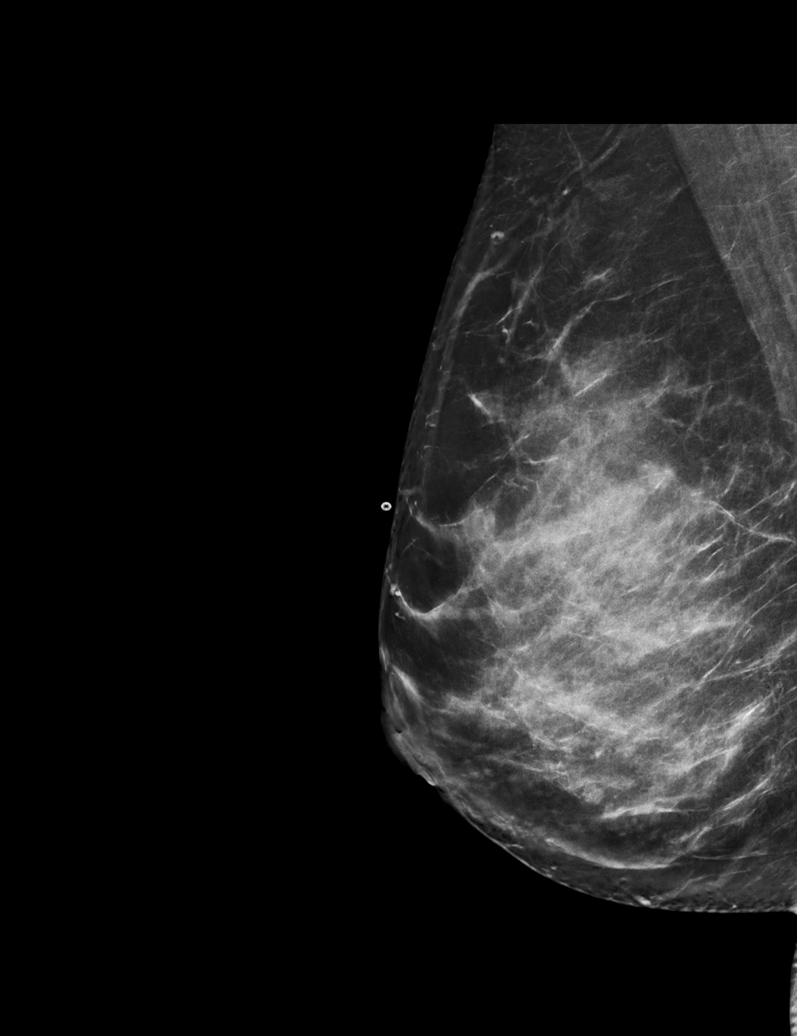

[R TAN synth-2D]
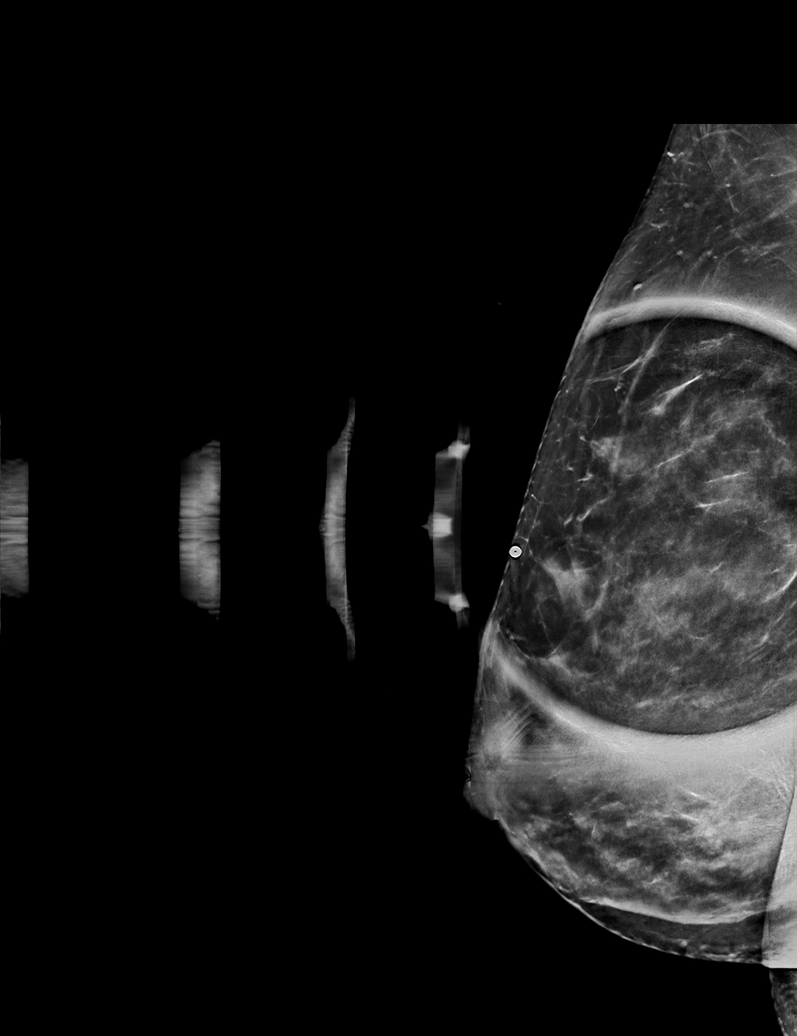

[L CC synth-2D]
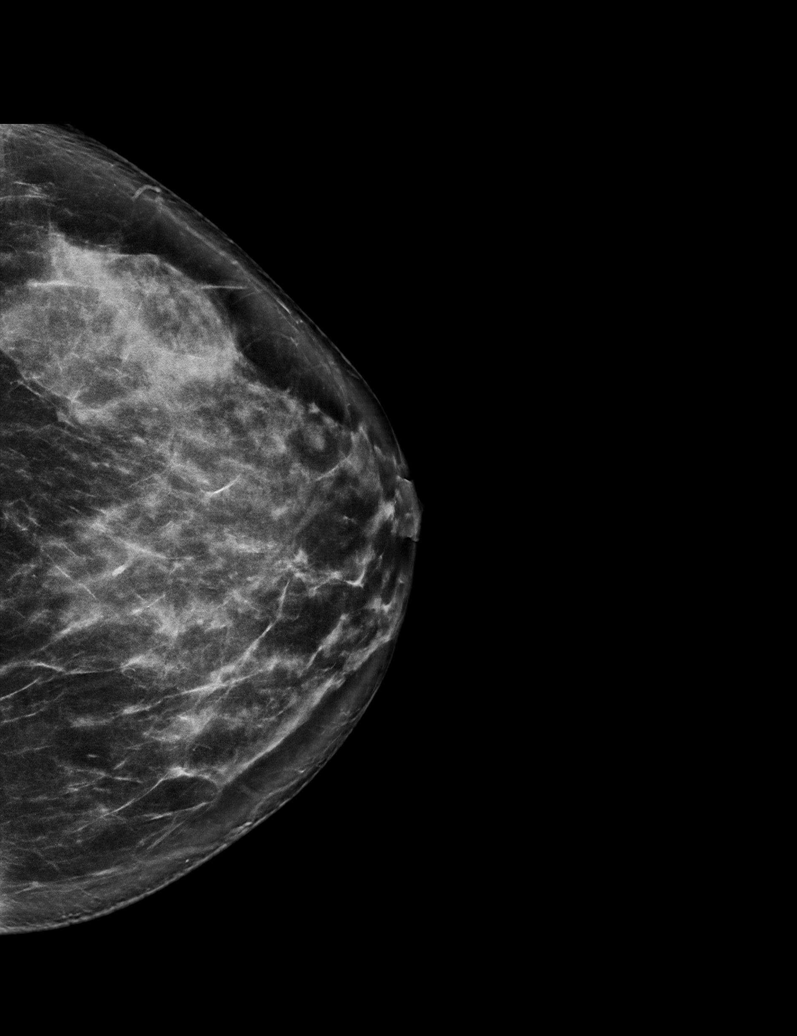

[R CC synth-2D]
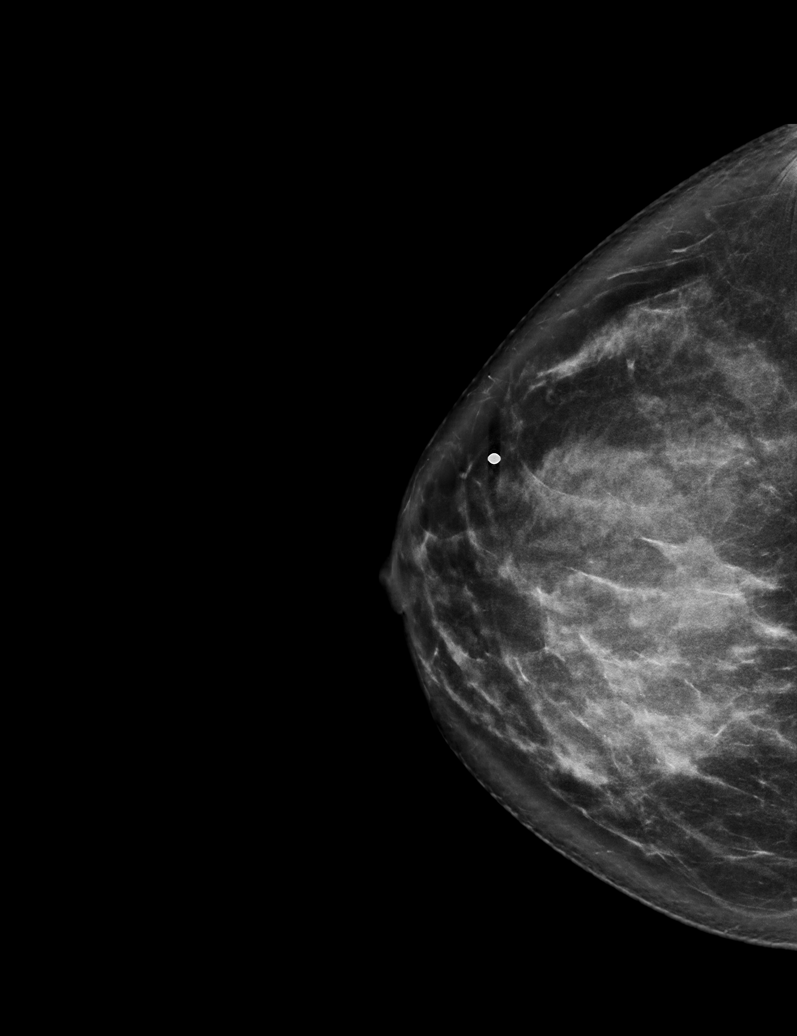

[L MLO synth-2D]
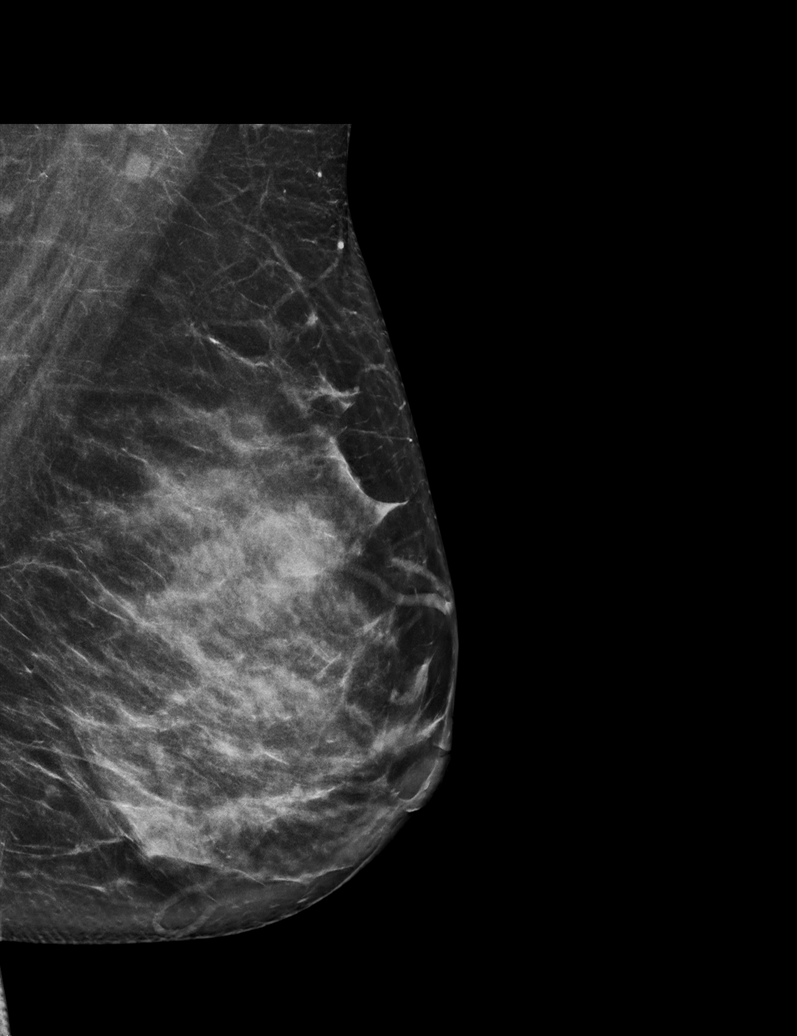

[R TAN tomo · tomo slice 39/77.0]
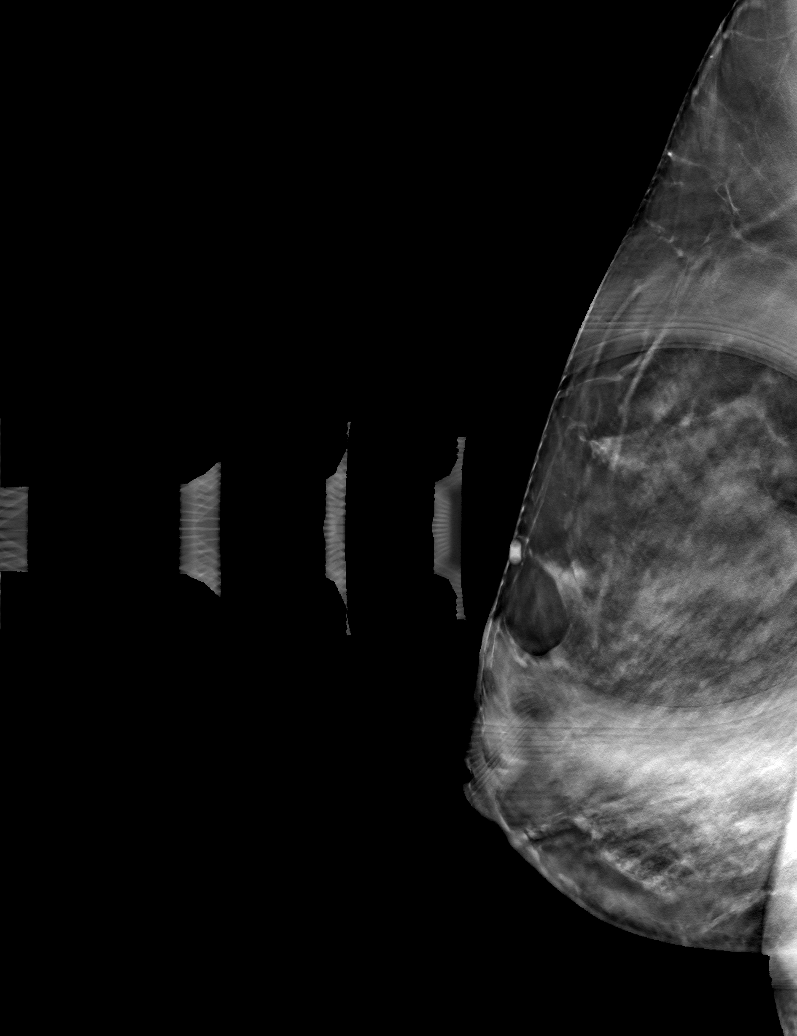

[6 of 30 positions shown; findings below may reference images not displayed]

ACR Breast Density Category c: The breast tissue is heterogeneously
dense, which may obscure small masses.
FINDINGS: Multiple obscured masses are seen bilaterally. There is an obscured
mass in the region of the patient's palpable lump. No suspicious
mammographic findings.

On physical exam, no suspicious lumps are identified.

Targeted ultrasound is performed, showing bilateral fibrocystic
changes accounting for the mammographic findings.
IMPRESSION: Fibrocystic changes.  No evidence of malignancy.

RECOMMENDATION:
Annual screening mammography beginning at the age of 40.

I have discussed the findings and recommendations with the patient.
If applicable, a reminder letter will be sent to the patient
regarding the next appointment.

BI-RADS CATEGORY  2: Benign.
ACR Breast Density Category c: The breast tissue is heterogeneously
dense, which may obscure small masses.
FINDINGS: Multiple obscured masses are seen bilaterally. There is an obscured
mass in the region of the patient's palpable lump. No suspicious
mammographic findings.

On physical exam, no suspicious lumps are identified.

Targeted ultrasound is performed, showing bilateral fibrocystic
changes accounting for the mammographic findings.
IMPRESSION: Fibrocystic changes.  No evidence of malignancy.

RECOMMENDATION:
Annual screening mammography beginning at the age of 40.

I have discussed the findings and recommendations with the patient.
If applicable, a reminder letter will be sent to the patient
regarding the next appointment.

BI-RADS CATEGORY  2: Benign.

## 2022-11-10 ENCOUNTER — Encounter: Payer: BC Managed Care – PPO | Admitting: Certified Nurse Midwife

## 2022-12-06 ENCOUNTER — Other Ambulatory Visit (HOSPITAL_COMMUNITY)
Admission: RE | Admit: 2022-12-06 | Discharge: 2022-12-06 | Disposition: A | Discharge status: Home / Self Care | Payer: BC Managed Care – PPO | Source: Ambulatory Visit | Attending: Obstetrics and Gynecology | Admitting: Obstetrics and Gynecology

## 2022-12-06 ENCOUNTER — Encounter: Payer: Self-pay | Admitting: Obstetrics and Gynecology

## 2022-12-06 ENCOUNTER — Ambulatory Visit (INDEPENDENT_AMBULATORY_CARE_PROVIDER_SITE_OTHER): Payer: BC Managed Care – PPO | Admitting: Obstetrics and Gynecology

## 2022-12-06 VITALS — BP 117/71 | HR 61 | Ht 62.0 in | Wt 129.0 lb

## 2022-12-06 DIAGNOSIS — Z01419 Encounter for gynecological examination (general) (routine) without abnormal findings: Secondary | ICD-10-CM | POA: Insufficient documentation

## 2022-12-06 NOTE — Progress Notes (Signed)
   ANNUAL EXAM Patient name: Lori Thompson MRN [id number]  Date of birth: [birth year] Chief Complaint:   No chief complaint on file.  History of Present Illness:   Lori Thompson is a 42 y.o. G2P2 female being seen today for a routine annual exam.   Current complaints: None. Regular menses.   Current birth control: Tubal  Patient's last menstrual period was 12/01/2022.   Last MXR: Not yet had Last Pap/Pap History: 2017. Results were: NILM w/ HRHPV negative. H/O abnormal pap: no   Health Maintenance Due  Topic Date Due   PAP SMEAR-Modifier  Never done   COVID-19 Vaccine (3 - 2023-24 season) 02/03/2022    Review of Systems:   Pertinent items are noted in HPI Denies any headaches, blurred vision, fatigue, shortness of breath, chest pain, abdominal pain, abnormal vaginal discharge/itching/odor/irritation, problems with periods, bowel movements, urination, or intercourse unless otherwise stated above.  Pertinent History Reviewed:  Reviewed past medical,surgical, social and family history.  Reviewed problem list, medications and allergies. Physical Assessment:   Vitals:   12/06/22 1046  BP: 117/71  Pulse: 61  Weight: 129 lb (58.5 kg)  Height: 5\' 2"  (1.575 m)  Body mass index is 23.59 kg/m.   Physical Examination:  General appearance - well appearing, and in no distress Mental status - alert, oriented to person, place, and time Psych:  She has a normal mood and affect Skin - warm and dry, normal color, no suspicious lesions noted Chest - effort normal Heart - normal rate  Breasts - breasts appear normal, no suspicious masses, no skin or nipple changes or axillary nodes Abdomen - soft, nontender, nondistended, no masses or organomegaly Pelvic -  VULVA: normal appearing vulva with no masses, tenderness or lesions  VAGINA: normal appearing vagina with normal color and discharge, no lesions  CERVIX: normal appearing cervix without discharge or lesions, no CMT UTERUS:  uterus is felt to be normal size, shape, consistency and nontender  ADNEXA: No adnexal masses or tenderness noted. Extremities:  No swelling or varicosities noted  Chaperone present for exam  No results found for this or any previous visit (from the past 24 hour(s)).  Assessment & Plan:  Diagnoses and all orders for this visit:  Encounter for annual routine gynecological examination -     Cytology - PAP( [facility])  - Cervical cancer screening: Discussed guidelines. Pap with HPV done - STD Testing: accepts - Birth Control:   Tubal - Breast Health: Encouraged self breast awareness/SBE. Teaching provided. Discussed limits of clinical breast exam for detecting breast cancer.  She declines MXR at this point. Discussed recommendations for age 59-50 and then for the 50+ population. If she wishes to have MXR sooner, she will contact me.  - F/U 12 months and prn   No orders of the defined types were placed in this encounter.   Meds: No orders of the defined types were placed in this encounter.   Follow-up: Return in about 1 year (around 12/06/2023) for annual.  Milas Hock, MD 12/06/2022 11:27 AM

## 2022-12-11 LAB — CYTOLOGY - PAP
Chlamydia: NEGATIVE
Comment: NEGATIVE
Comment: NEGATIVE
Comment: NORMAL
Diagnosis: NEGATIVE
High risk HPV: NEGATIVE
Neisseria Gonorrhea: NEGATIVE

## 2022-12-12 ENCOUNTER — Encounter: Payer: Self-pay | Admitting: *Deleted

## 2022-12-12 NOTE — Progress Notes (Signed)
Letter sent to pt's home address of normal pap results.

## 2023-12-12 ENCOUNTER — Telehealth: Payer: Self-pay | Admitting: *Deleted

## 2023-12-12 NOTE — Telephone Encounter (Signed)
 Patient not able to schedule annual at this time. Requested call back on 12/13/2023.

## 2024-01-17 ENCOUNTER — Ambulatory Visit: Admitting: Obstetrics and Gynecology

## 2024-01-17 ENCOUNTER — Encounter: Payer: Self-pay | Admitting: Obstetrics and Gynecology

## 2024-01-17 VITALS — BP 109/73 | HR 61 | Ht 62.0 in | Wt 125.0 lb

## 2024-01-17 DIAGNOSIS — Z01419 Encounter for gynecological examination (general) (routine) without abnormal findings: Secondary | ICD-10-CM | POA: Diagnosis not present

## 2024-01-17 DIAGNOSIS — Z1331 Encounter for screening for depression: Secondary | ICD-10-CM | POA: Diagnosis not present

## 2024-01-17 DIAGNOSIS — N6321 Unspecified lump in the left breast, upper outer quadrant: Secondary | ICD-10-CM | POA: Diagnosis not present

## 2024-01-17 NOTE — Progress Notes (Signed)
 ANNUAL EXAM Patient name: Lori Thompson MRN [id number]  Date of birth: December 21, 1980 Chief Complaint:   Gynecologic Exam (Pt reported here for annual, no concerns reported.)  History of Present Illness:   Lori Thompson is a 43 y.o. G2P2 female being seen today for a routine annual exam.   Current concerns:  She experiences night sweats and increased joint pain, primarily affecting her hips. Initially, she attributed the joint pain to a new mattress. No dizziness, lightheadedness, or excessive fatigue related to menstrual changes.  Her menstrual cycles remain regular with a variation of about a week, and there are no significant changes in the heaviness or frequency of her periods.  Current birth control: Tubal  For new life events, she is engaged and will be married next year!  Discussed the use of AI scribe software for clinical note transcription with the patient, who gave verbal consent to proceed.  Patient's last menstrual period was 01/04/2024 (approximate).  Last MXR: Not yet had Last Pap/Pap History:  2010: pap wnl 2017. Results were: NILM w/ HRHPV negative. H/O abnormal pap: no 2024: pap/hpv wnl  Review of Systems:   Pertinent items are noted in HPI Denies any headaches, blurred vision, fatigue, shortness of breath, chest pain, abdominal pain, abnormal vaginal discharge/itching/odor/irritation, problems with periods, bowel movements, urination, or intercourse unless otherwise stated above.  Pertinent History Reviewed:  Reviewed past medical,surgical, social and family history.  Reviewed problem list, medications and allergies. Physical Assessment:   Vitals:   01/17/24 1022  BP: 109/73  Pulse: 61  Weight: 125 lb (56.7 kg)  Height: 5' 2 (1.575 m)   Body mass index is 22.86 kg/m.   Physical Examination:  General appearance - well appearing, and in no distress Mental status - alert, oriented to person, place, and time Psych:  She has a normal mood and  affect Skin - warm and dry, normal color, no suspicious lesions noted Chest - effort normal Heart - normal rate  Breasts - breasts appear normal, no skin or nipple changes or axillary nodes. Left breast with 2 cm round, well circumscribed mobile nodule at 2 oclock just outside of the nipple.  Abdomen - soft, nontender, nondistended, no masses or organomegaly Pelvic -  not indicated VULVA: Not examined VAGINA: Not examined CERVIX: Not examined UTERUS: Not examined ADNEXA: Not examined Extremities:  No swelling or varicosities noted  Chaperone present for exam  No results found for this or any previous visit (from the past 24 hours).  Assessment & Plan:  Lori Thompson was seen today for gynecologic exam.  Diagnoses and all orders for this visit:  Encounter for annual routine gynecological examination - Cervical cancer screening: Discussed guidelines. Pap with HPV wnl 01/2023 - STD Testing: Declines this year, maybe next year.  - Birth Control:  Tubal - Breast Health: Encouraged self breast awareness/SBE. Teaching provided. Discussed limits of clinical breast exam for detecting breast cancer. She accepts MXR this year due to palpable abnormality - F/U 12 months and prn -     MM 3D SCREENING MAMMOGRAM BILATERAL BREAST; Future -     Ambulatory referral to Family Practice -     MM 3D SCREENING MAMMOGRAM UNILATERAL RIGHT BREAST; Future -     MM Digital Diagnostic Unilat L; Future -     US  LIMITED ULTRASOUND INCLUDING AXILLA LEFT BREAST ; Future  Mass of upper outer quadrant of left breast -     MM 3D SCREENING MAMMOGRAM UNILATERAL RIGHT BREAST; Future -  MM Digital Diagnostic Unilat L; Future -     US  LIMITED ULTRASOUND INCLUDING AXILLA LEFT BREAST ; Future   Orders Placed This Encounter  Procedures   MM 3D SCREENING MAMMOGRAM BILATERAL BREAST   MM 3D SCREENING MAMMOGRAM UNILATERAL RIGHT BREAST   MM Digital Diagnostic Unilat L   US  LIMITED ULTRASOUND INCLUDING AXILLA LEFT BREAST     Ambulatory referral to Family Practice    Meds: No orders of the defined types were placed in this encounter.   Follow-up: No follow-ups on file.  Vina Solian, MD 01/17/2024 11:03 AM

## 2024-01-31 ENCOUNTER — Ambulatory Visit
Admission: RE | Admit: 2024-01-31 | Discharge: 2024-01-31 | Disposition: A | Discharge status: Home / Self Care | Source: Ambulatory Visit | Attending: Obstetrics and Gynecology

## 2024-01-31 ENCOUNTER — Other Ambulatory Visit: Payer: Self-pay | Admitting: Obstetrics and Gynecology

## 2024-01-31 ENCOUNTER — Ambulatory Visit
Admission: RE | Admit: 2024-01-31 | Discharge: 2024-01-31 | Disposition: A | Discharge status: Home / Self Care | Source: Ambulatory Visit | Attending: Obstetrics and Gynecology | Admitting: Obstetrics and Gynecology

## 2024-01-31 DIAGNOSIS — N6321 Unspecified lump in the left breast, upper outer quadrant: Secondary | ICD-10-CM

## 2024-01-31 DIAGNOSIS — N6311 Unspecified lump in the right breast, upper outer quadrant: Secondary | ICD-10-CM

## 2024-01-31 DIAGNOSIS — Z01419 Encounter for gynecological examination (general) (routine) without abnormal findings: Secondary | ICD-10-CM

## 2024-02-12 ENCOUNTER — Other Ambulatory Visit

## 2024-02-12 ENCOUNTER — Encounter

## 2024-04-21 ENCOUNTER — Encounter: Payer: Self-pay | Admitting: Urgent Care

## 2024-04-21 ENCOUNTER — Ambulatory Visit: Payer: Self-pay | Admitting: Urgent Care

## 2024-04-21 ENCOUNTER — Ambulatory Visit: Admitting: Urgent Care

## 2024-04-21 ENCOUNTER — Ambulatory Visit

## 2024-04-21 VITALS — BP 104/69 | HR 49 | Ht 62.0 in | Wt 131.0 lb

## 2024-04-21 DIAGNOSIS — M546 Pain in thoracic spine: Secondary | ICD-10-CM | POA: Diagnosis not present

## 2024-04-21 DIAGNOSIS — R001 Bradycardia, unspecified: Secondary | ICD-10-CM | POA: Diagnosis not present

## 2024-04-21 DIAGNOSIS — M25551 Pain in right hip: Secondary | ICD-10-CM

## 2024-04-21 DIAGNOSIS — M25552 Pain in left hip: Secondary | ICD-10-CM | POA: Diagnosis not present

## 2024-04-21 MED ORDER — BACLOFEN 10 MG PO TABS
10.0000 mg | ORAL_TABLET | Freq: Three times a day (TID) | ORAL | 0 refills | Status: AC
Start: 2024-04-21 — End: ?

## 2024-04-21 MED ORDER — DICLOFENAC SODIUM 75 MG PO TBEC: 75.0000 mg | DELAYED_RELEASE_TABLET | Freq: Two times a day (BID) | ORAL | 0 refills | Status: AC

## 2024-04-21 NOTE — Progress Notes (Signed)
 Lori Thompson

## 2024-04-21 NOTE — Progress Notes (Signed)
 New Patient Office Visit  Subjective:  Patient ID: Lori Thompson, female    DOB: 1980-12-25  Age: 43 y.o. MRN: [id number]  CC:  Chief Complaint  Patient presents with   Establish Care    Neck/shoulder pain, mid back pain and hip pain    HPI Lori Thompson presents to establish care.  Discussed the use of AI scribe software for clinical note transcription with the patient, who gave verbal consent to proceed.  History of Present Illness   Lori Thompson is a 43 year old female who presents with persistent mid-back and hip pain.  She has been experiencing mid-back pain for about a month, located at the midline where her bra strap meets. The pain feels like a bruise and worsens with movement, particularly during exercise. It is constant, though some days are better than others. She had x-rays done last year during an ER visit for chest pain, which showed no significant findings related to her current back pain.  She also has hip pain that has been ongoing for about a year. As a side sleeper, the pain is alleviated when she sleeps on one side but worsens if she sleeps on the other. The pain sometimes radiates down her leg, which she describes as similar to sciatica, though it has not been professionally diagnosed. It is exacerbated by prolonged sitting and changes in position. She occasionally takes ibuprofen, which provides some relief.  She exercises regularly, engaging in yoga and alternating between arm, leg, and cardio workouts six days a week. 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 She is not on any medications and prefers to avoid them. No family history of autoimmune disorders. No rashes or other unusual symptoms. History of bradycardia with a heart rate of 49, attributed to her high level of physical activity.       Outpatient Encounter Medications as of 04/21/2024  Medication Sig    diclofenac (VOLTAREN) 75 MG EC tablet Take 1 tablet (75 mg total) by mouth 2 (two) times daily with a meal.   No facility-administered encounter medications on file as of 04/21/2024.    Past Medical History:  Diagnosis Date   Abnormal Pap smear of cervix    Anxiety     Past Surgical History:  Procedure Laterality Date   CESAREAN SECTION  2011,2009   TUBAL LIGATION  07/2009    Family History  Problem Relation Age of Onset   Hyperlipidemia Mother    Hypertension Mother    Stroke Maternal Grandfather    Heart disease Paternal Grandmother    Breast cancer Neg Hx    Colon cancer Neg Hx    Ovarian cancer Neg Hx     Social History   Socioeconomic History   Marital status: Divorced    Spouse name: Not on file   Number of children: Not on file   Years of education: Not on file   Highest education level: Associate degree: academic program  Occupational History   Not on file  Tobacco Use   Smoking status: Former    Current packs/day: 0.00    Types: Cigarettes    Quit date: 07/2015    Years since quitting: 8.7   Smokeless tobacco: Never  Vaping Use   Vaping status: Never Used  Substance and Sexual Activity   Alcohol use: Not Currently   Drug use: Never   Sexual activity: Yes    Birth control/protection: Surgical  Other Topics Concern   Not on file  Social History Narrative   Not on file   Social Drivers of Health   Financial Resource Strain: Low Risk  (04/18/2024)   Overall Financial Resource Strain (CARDIA)    Difficulty of Paying Living Expenses: Not hard at all  Food Insecurity: No Food Insecurity (04/18/2024)   Hunger Vital Sign    Worried About Running Out of Food in the Last Year: Never true    Ran Out of Food in the Last Year: Never true  Transportation Needs: No Transportation Needs (04/18/2024)   PRAPARE - Administrator, Civil Service (Medical): No    Lack of Transportation (Non-Medical): No  Physical Activity: Sufficiently Active  (04/18/2024)   Exercise Vital Sign    Days of Exercise per Week: 6 days    Minutes of Exercise per Session: 40 min  Stress: No Stress Concern Present (04/18/2024)   [employer] of Occupational Health - Occupational Stress Questionnaire    Feeling of Stress: Only a little  Social Connections: Moderately Isolated (04/18/2024)   Social Connection and Isolation Panel    Frequency of Communication with Friends and Family: More than three times a week    Frequency of Social Gatherings with Friends and Family: More than three times a week    Attends Religious Services: Patient declined    Database Administrator or Organizations: No    Attends Engineer, Structural: Not on file    Marital Status: Living with partner  Intimate Partner Violence: Not on file    ROS: as noted in HPI  Objective:  BP 104/69   Pulse (!) 49   Ht 5' 2 (1.575 m)   Wt 131 lb (59.4 kg)   SpO2 100%   BMI 23.96 kg/m   Physical Exam Vitals and nursing note reviewed. Exam conducted with a chaperone present.  Constitutional:      General: She is not in acute distress.    Appearance: Normal appearance. She is normal weight. She is not ill-appearing, toxic-appearing or diaphoretic.  HENT:     Head: Normocephalic and atraumatic.  Eyes:     General: No scleral icterus.       Right eye: No discharge.        Left eye: No discharge.     Extraocular Movements: Extraocular movements intact.     Pupils: Pupils are equal, round, and reactive to light.  Cardiovascular:     Rate and Rhythm: Regular rhythm. Bradycardia present.  Pulmonary:     Effort: Pulmonary effort is normal. No respiratory distress.  Musculoskeletal:     Thoracic back: Bony tenderness present. No swelling, edema, deformity, signs of trauma, lacerations, spasms or tenderness. No scoliosis.     Lumbar back: Normal. No swelling, deformity, spasms, tenderness or bony tenderness. Normal range of motion. Negative right straight leg raise test  and negative left straight leg raise test.       Back:     Right hip: Bony tenderness present. No tenderness or crepitus. Normal range of motion. Normal strength.     Left hip: Bony tenderness present. No tenderness or crepitus. Normal range of motion. Normal strength.     Right upper leg: Normal.     Left upper leg: Normal.       Legs:     Comments: Tenderness located over greater trochanters bilaterally Negative FABER test bilaterally  Lymphadenopathy:     Cervical: No cervical adenopathy.  Skin:  General: Skin is warm and dry.     Coloration: Skin is not jaundiced.     Findings: No bruising, erythema or rash.  Neurological:     General: No focal deficit present.     Mental Status: She is alert and oriented to person, place, and time.     Gait: Gait normal.  Psychiatric:        Mood and Affect: Mood normal.        Behavior: Behavior normal.       Assessment & Plan:  Acute bilateral thoracic back pain -     DG Thoracic Spine W/Swimmers; Future -     Diclofenac Sodium; Take 1 tablet (75 mg total) by mouth 2 (two) times daily with a meal.  Dispense: 30 tablet; Refill: 0  Bilateral hip pain -     CMP14+EGFR -     CBC with Differential/Platelet -     Sedimentation rate -     C-reactive protein -     ANA,IFA RA Diag Pnl w/rflx Tit/Patn -     Diclofenac Sodium; Take 1 tablet (75 mg total) by mouth 2 (two) times daily with a meal.  Dispense: 30 tablet; Refill: 0  Chronic sinus bradycardia  Assessment and Plan    Thoracic spine pain Mid thoracic spine pain for a month, constant, worsened by movement, with point tenderness at T5. Differential includes musculoskeletal pain.  - Ordered thoracic spine x-ray to evaluate T5 tenderness. - Ordered blood work for inflammatory and autoimmune markers.  Right hip pain likely due to greater trochanteric bursitis Chronic right hip pain for a year, worsened by certain positions and movements. Differential includes greater trochanteric  bursitis and meralgia paresthetica. - Prescribed anti-inflammatory medication for 1-2 weeks, to be taken with food. - Provided exercises for bursitis management.  - inflammatory markers  Bradycardia Asymptomatic, chronic. Pt physically active. Monitor.       No follow-ups on file.   Benton LITTIE Gave, PA

## 2024-04-21 NOTE — Patient Instructions (Signed)
 Please go to [address] to get xray images of your thoracic spine.  Please take the diclofenac twice daily with food to see if this helps with your symptoms. Please do the hip bursitis rehab exercises attached to this form.  We drew labs and will call with the results.   Please follow up in office if symptoms persist or fail to respond to treatment plan offered today.

## 2024-04-23 LAB — CBC WITH DIFFERENTIAL/PLATELET
Basophils Absolute: 0.1 x10E3/uL (ref 0.0–0.2)
Basos: 1 %
EOS (ABSOLUTE): 0.2 x10E3/uL (ref 0.0–0.4)
Eos: 3 %
Hematocrit: 38.6 % (ref 34.0–46.6)
Hemoglobin: 12.3 g/dL (ref 11.1–15.9)
Immature Grans (Abs): 0 x10E3/uL (ref 0.0–0.1)
Immature Granulocytes: 0 %
Lymphocytes Absolute: 2.1 x10E3/uL (ref 0.7–3.1)
Lymphs: 31 %
MCH: 29.2 pg (ref 26.6–33.0)
MCHC: 31.9 g/dL (ref 31.5–35.7)
MCV: 92 fL (ref 79–97)
Monocytes Absolute: 0.5 x10E3/uL (ref 0.1–0.9)
Monocytes: 7 %
Neutrophils Absolute: 4.1 x10E3/uL (ref 1.4–7.0)
Neutrophils: 58 %
Platelets: 316 x10E3/uL (ref 150–450)
RBC: 4.21 x10E6/uL (ref 3.77–5.28)
RDW: 11.9 % (ref 11.7–15.4)
WBC: 7 x10E3/uL (ref 3.4–10.8)

## 2024-04-23 LAB — CMP14+EGFR
ALT: 10 IU/L (ref 0–32)
AST: 17 IU/L (ref 0–40)
Albumin: 4 g/dL (ref 3.9–4.9)
Alkaline Phosphatase: 64 IU/L (ref 41–116)
BUN/Creatinine Ratio: 28 — ABNORMAL HIGH (ref 9–23)
BUN: 15 mg/dL (ref 6–24)
Bilirubin Total: 0.2 mg/dL (ref 0.0–1.2)
CO2: 24 mmol/L (ref 20–29)
Calcium: 9.2 mg/dL (ref 8.7–10.2)
Chloride: 104 mmol/L (ref 96–106)
Creatinine, Ser: 0.54 mg/dL — ABNORMAL LOW (ref 0.57–1.00)
Globulin, Total: 2.6 g/dL (ref 1.5–4.5)
Glucose: 89 mg/dL (ref 70–99)
Potassium: 4.6 mmol/L (ref 3.5–5.2)
Sodium: 138 mmol/L (ref 134–144)
Total Protein: 6.6 g/dL (ref 6.0–8.5)
eGFR: 118 mL/min/1.73 (ref >59)

## 2024-04-23 LAB — SEDIMENTATION RATE: Sed Rate: 15 mm/h (ref 0–32)

## 2024-04-23 LAB — ANA,IFA RA DIAG PNL W/RFLX TIT/PATN
Cyclic Citrullin Peptide Ab: 8 U (ref 0–19)
Rheumatoid fact SerPl-aCnc: <10 [IU]/mL (ref <14.0)

## 2024-04-23 LAB — C-REACTIVE PROTEIN: CRP: 3 mg/L (ref 0–10)

## 2024-08-04 ENCOUNTER — Encounter

## 2024-08-04 ENCOUNTER — Other Ambulatory Visit

## 2024-08-11 ENCOUNTER — Encounter

## 2024-08-11 ENCOUNTER — Other Ambulatory Visit
